# Patient Record
Sex: Female | Born: 1960 | Race: Black or African American | Hispanic: No | Marital: Single | Smoking: Never smoker
Health system: Southern US, Community
[De-identification: ages and names within clinical notes are randomized; demographics above are authoritative.]

## PROBLEM LIST (undated history)

## (undated) ENCOUNTER — Emergency Department (HOSPITAL_COMMUNITY): Payer: Medicare Other | Source: Home / Self Care

## (undated) DIAGNOSIS — C801 Malignant (primary) neoplasm, unspecified: Secondary | ICD-10-CM

## (undated) DIAGNOSIS — H5440 Blindness, one eye, unspecified eye: Secondary | ICD-10-CM

## (undated) DIAGNOSIS — E079 Disorder of thyroid, unspecified: Secondary | ICD-10-CM

## (undated) DIAGNOSIS — F79 Unspecified intellectual disabilities: Secondary | ICD-10-CM

## (undated) DIAGNOSIS — N189 Chronic kidney disease, unspecified: Secondary | ICD-10-CM

## (undated) DIAGNOSIS — E039 Hypothyroidism, unspecified: Secondary | ICD-10-CM

## (undated) DIAGNOSIS — M1990 Unspecified osteoarthritis, unspecified site: Secondary | ICD-10-CM

## (undated) DIAGNOSIS — R6250 Unspecified lack of expected normal physiological development in childhood: Secondary | ICD-10-CM

## (undated) DIAGNOSIS — G709 Myoneural disorder, unspecified: Secondary | ICD-10-CM

## (undated) HISTORY — PX: ABDOMINAL HYSTERECTOMY: SHX81

## (undated) HISTORY — DX: Disorder of thyroid, unspecified: E07.9

## (undated) HISTORY — PX: FOOT SURGERY: SHX648

---

## 2000-03-31 ENCOUNTER — Encounter: Payer: Self-pay | Admitting: Emergency Medicine

## 2000-03-31 ENCOUNTER — Emergency Department (HOSPITAL_COMMUNITY): Admission: EM | Admit: 2000-03-31 | Discharge: 2000-03-31 | Payer: Self-pay | Admitting: Emergency Medicine

## 2006-05-25 ENCOUNTER — Emergency Department (HOSPITAL_COMMUNITY): Admission: EM | Admit: 2006-05-25 | Discharge: 2006-05-25 | Payer: Self-pay | Admitting: Emergency Medicine

## 2006-05-30 ENCOUNTER — Observation Stay (HOSPITAL_COMMUNITY): Admission: RE | Admit: 2006-05-30 | Discharge: 2006-05-31 | Payer: Self-pay | Admitting: Orthopaedic Surgery

## 2015-12-06 LAB — HM MAMMOGRAPHY

## 2017-05-16 ENCOUNTER — Encounter: Payer: Self-pay | Admitting: Family Medicine

## 2017-05-16 ENCOUNTER — Ambulatory Visit (INDEPENDENT_AMBULATORY_CARE_PROVIDER_SITE_OTHER): Payer: Medicare Other | Admitting: Family Medicine

## 2017-05-16 VITALS — BP 118/76 | HR 76 | Ht 59.0 in | Wt 165.1 lb

## 2017-05-16 DIAGNOSIS — Z0000 Encounter for general adult medical examination without abnormal findings: Secondary | ICD-10-CM

## 2017-05-16 DIAGNOSIS — B351 Tinea unguium: Secondary | ICD-10-CM | POA: Diagnosis not present

## 2017-05-16 DIAGNOSIS — Z7289 Other problems related to lifestyle: Secondary | ICD-10-CM

## 2017-05-16 DIAGNOSIS — L853 Xerosis cutis: Secondary | ICD-10-CM | POA: Insufficient documentation

## 2017-05-16 DIAGNOSIS — R4189 Other symptoms and signs involving cognitive functions and awareness: Secondary | ICD-10-CM | POA: Insufficient documentation

## 2017-05-16 DIAGNOSIS — H5440 Blindness, one eye, unspecified eye: Secondary | ICD-10-CM | POA: Diagnosis not present

## 2017-05-16 DIAGNOSIS — F79 Unspecified intellectual disabilities: Secondary | ICD-10-CM

## 2017-05-16 DIAGNOSIS — E559 Vitamin D deficiency, unspecified: Secondary | ICD-10-CM

## 2017-05-16 NOTE — Progress Notes (Addendum)
Subjective:  Patient ID: Abigail Webster, female    DOB: Jan 06, 1961  Age: 57 y.o. MRN: [id number]  CC: Establish Care   HPI LAURALEI CLOUSE presents for establishment of care with her primary caregiver.  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  She has been at this home for a little over a year.  Her mother and father lived to be in their late 59s.  Their health history is unknown past that.  This patient does not drink alcohol, smoke or use illicit drugs.  She is taking an over-the-counter vitamin D supplement.  Chart review shows that she has had a hysterectomy in the past.  She does chew and bite on her left hand.  She is nonfasting this morning.  She is blind in her right eye.  She goes to the nail parlor every 2 weeks.  History Sabrina has no past medical history on file.   She has no past surgical history on file.   Her family history is not on file.She reports that  has never smoked. she has never used smokeless tobacco. Her alcohol and drug histories are not on file.  Outpatient Medications Prior to Visit  Medication Sig Dispense Refill  . Cholecalciferol (VITAMIN D3 PO) Take 200 IU daily.    . Emollient (EUCERIN EX) Apply topically.     No facility-administered medications prior to visit.     ROS Review of Systems  Constitutional: Negative for activity change, chills and fever.  HENT: Negative.   Eyes: Positive for visual disturbance.  Respiratory: Negative for cough.   Cardiovascular: Negative.   Gastrointestinal: Negative.   Endocrine: Negative for polyphagia and polyuria.  Genitourinary: Negative for difficulty urinating.  Skin: Positive for wound.  Allergic/Immunologic: Negative for immunocompromised state.  Neurological: Negative.   Psychiatric/Behavioral: Positive for behavioral problems.    Objective:  BP 118/76 (BP Location: Right Arm, Patient Position: Sitting, Cuff Size: Normal)   Pulse 76   Ht 4\' 11"  (1.499 m)   Wt 165 lb 2 oz (74.9 kg)   SpO2  97%   BMI 33.35 kg/m   Physical Exam  Constitutional: She appears well-developed and well-nourished. No distress.  HENT:  Head: Normocephalic and atraumatic.  Right Ear: External ear normal.  Left Ear: External ear normal.  Mouth/Throat: Oropharynx is clear and moist.  Eyes: Conjunctivae are normal. Right eye exhibits no discharge. Left eye exhibits no discharge. No scleral icterus.  Neck: Neck supple. No JVD present. No tracheal deviation present. No thyromegaly present.  Cardiovascular: Normal rate, regular rhythm and normal heart sounds.  Pulmonary/Chest: Effort normal and breath sounds normal. No stridor.  Abdominal: Bowel sounds are normal.  Lymphadenopathy:    She has no cervical adenopathy.  Skin: Skin is dry. She is not diaphoretic.     Psychiatric: Her behavior is normal.      Assessment & Plan:   Sharnika was seen today for establish care.  Diagnoses and all orders for this visit:  Mentally challenged  Healthcare maintenance -     CBC; Future -     Comprehensive metabolic panel; Future -     Lipid panel; Future -     TSH; Future -     Urinalysis, Routine w reflex microscopic; Future -     HIV antibody -     Hepatitis C antibody -     MM Digital Screening; Future -     Ambulatory referral to Ophthalmology  Self-mutilation  Vitamin D deficiency -  VITAMIN D 25 Hydroxy (Vit-D Deficiency, Fractures); Future  Xerosis cutis  Onychomycosis -     Ambulatory referral to Podiatry  Blind right eye   I am having Abigail Webster maintain her Cholecalciferol (VITAMIN D3 PO) and Emollient (EUCERIN EX).  No orders of the defined types were placed in this encounter.  She will return in the morning fasting with her caregiver for routine blood work.   I recommended that she use Eucerin cream for her hand and skin.  Follow-up: Return in about 3 months (around 08/14/2017).  [name], MD

## 2017-05-17 ENCOUNTER — Other Ambulatory Visit (INDEPENDENT_AMBULATORY_CARE_PROVIDER_SITE_OTHER): Payer: Medicare Other

## 2017-05-17 ENCOUNTER — Ambulatory Visit (INDEPENDENT_AMBULATORY_CARE_PROVIDER_SITE_OTHER): Payer: Medicare Other | Admitting: Behavioral Health

## 2017-05-17 DIAGNOSIS — B351 Tinea unguium: Secondary | ICD-10-CM | POA: Insufficient documentation

## 2017-05-17 DIAGNOSIS — E559 Vitamin D deficiency, unspecified: Secondary | ICD-10-CM | POA: Diagnosis not present

## 2017-05-17 DIAGNOSIS — Z0000 Encounter for general adult medical examination without abnormal findings: Secondary | ICD-10-CM

## 2017-05-17 DIAGNOSIS — H5440 Blindness, one eye, unspecified eye: Secondary | ICD-10-CM | POA: Insufficient documentation

## 2017-05-17 DIAGNOSIS — Z23 Encounter for immunization: Secondary | ICD-10-CM | POA: Diagnosis not present

## 2017-05-17 LAB — URINALYSIS, ROUTINE W REFLEX MICROSCOPIC
BILIRUBIN URINE: NEGATIVE
Ketones, ur: NEGATIVE
Leukocytes, UA: NEGATIVE
NITRITE: NEGATIVE
Specific Gravity, Urine: <=1.005 — AB (ref 1.000–1.030)
TOTAL PROTEIN, URINE-UPE24: NEGATIVE
UROBILINOGEN UA: 0.2 (ref 0.0–1.0)
Urine Glucose: NEGATIVE
WBC UA: NONE SEEN (ref 0–?)
pH: 6 (ref 5.0–8.0)

## 2017-05-17 LAB — COMPREHENSIVE METABOLIC PANEL
ALBUMIN: 4.3 g/dL (ref 3.5–5.2)
ALT: 12 U/L (ref 0–35)
AST: 17 U/L (ref 0–37)
Alkaline Phosphatase: 88 U/L (ref 39–117)
BILIRUBIN TOTAL: 0.6 mg/dL (ref 0.2–1.2)
BUN: 25 mg/dL — AB (ref 6–23)
CALCIUM: 9.7 mg/dL (ref 8.4–10.5)
CO2: 31 meq/L (ref 19–32)
CREATININE: 1.23 mg/dL — AB (ref 0.40–1.20)
Chloride: 105 mEq/L (ref 96–112)
GFR: 57.9 mL/min — ABNORMAL LOW (ref >60.00)
Glucose, Bld: 86 mg/dL (ref 70–99)
Potassium: 4.8 mEq/L (ref 3.5–5.1)
Sodium: 142 mEq/L (ref 135–145)
Total Protein: 6.8 g/dL (ref 6.0–8.3)

## 2017-05-17 LAB — CBC
HCT: 40 % (ref 36.0–46.0)
Hemoglobin: 12.9 g/dL (ref 12.0–15.0)
MCHC: 32.2 g/dL (ref 30.0–36.0)
MCV: 93.2 fl (ref 78.0–100.0)
Platelets: 230 10*3/uL (ref 150.0–400.0)
RBC: 4.29 Mil/uL (ref 3.87–5.11)
RDW: 14.2 % (ref 11.5–15.5)
WBC: 5 10*3/uL (ref 4.0–10.5)

## 2017-05-17 LAB — VITAMIN D 25 HYDROXY (VIT D DEFICIENCY, FRACTURES): VITD: 55.58 ng/mL (ref 30.00–100.00)

## 2017-05-17 LAB — LIPID PANEL
CHOLESTEROL: 200 mg/dL (ref 0–200)
HDL: 74.2 mg/dL (ref >39.00)
LDL Cholesterol: 119 mg/dL — ABNORMAL HIGH (ref 0–99)
NonHDL: 126.19
TRIGLYCERIDES: 37 mg/dL (ref 0.0–149.0)
Total CHOL/HDL Ratio: 3
VLDL: 7.4 mg/dL (ref 0.0–40.0)

## 2017-05-17 LAB — TSH: TSH: 5.8 u[IU]/mL — ABNORMAL HIGH (ref 0.35–4.50)

## 2017-05-17 NOTE — Progress Notes (Signed)
Patient came in clinic for influenza vaccination. IM injection was given in the right deltoid. Patient tolerated the injection well. No s/s of a reaction prior to leaving the nurse visit.

## 2017-05-17 NOTE — Addendum Note (Signed)
Addended by: Jon Billings on: 05/17/2017 08:47 AM   Modules accepted: Orders

## 2017-05-22 ENCOUNTER — Encounter: Payer: Self-pay | Admitting: Family Medicine

## 2017-05-23 ENCOUNTER — Ambulatory Visit (HOSPITAL_BASED_OUTPATIENT_CLINIC_OR_DEPARTMENT_OTHER)
Admission: RE | Admit: 2017-05-23 | Discharge: 2017-05-23 | Disposition: A | Discharge status: Home / Self Care | Payer: Medicare Other | Source: Ambulatory Visit | Attending: Family Medicine | Admitting: Family Medicine

## 2017-05-23 ENCOUNTER — Other Ambulatory Visit (INDEPENDENT_AMBULATORY_CARE_PROVIDER_SITE_OTHER): Payer: Medicare Other

## 2017-05-23 ENCOUNTER — Other Ambulatory Visit: Payer: Self-pay

## 2017-05-23 DIAGNOSIS — R7989 Other specified abnormal findings of blood chemistry: Secondary | ICD-10-CM

## 2017-05-23 DIAGNOSIS — Z1231 Encounter for screening mammogram for malignant neoplasm of breast: Secondary | ICD-10-CM | POA: Diagnosis present

## 2017-05-23 DIAGNOSIS — Z0000 Encounter for general adult medical examination without abnormal findings: Secondary | ICD-10-CM | POA: Diagnosis not present

## 2017-05-23 LAB — TSH: TSH: 5.61 u[IU]/mL — ABNORMAL HIGH (ref 0.35–4.50)

## 2017-05-25 ENCOUNTER — Ambulatory Visit: Payer: Medicare Other | Admitting: Family Medicine

## 2017-05-28 ENCOUNTER — Ambulatory Visit (INDEPENDENT_AMBULATORY_CARE_PROVIDER_SITE_OTHER): Payer: Medicare Other | Admitting: Family Medicine

## 2017-05-28 ENCOUNTER — Encounter: Payer: Self-pay | Admitting: Family Medicine

## 2017-05-28 VITALS — BP 120/80 | HR 65 | Ht 59.0 in | Wt 162.2 lb

## 2017-05-28 DIAGNOSIS — E039 Hypothyroidism, unspecified: Secondary | ICD-10-CM

## 2017-05-28 MED ORDER — LEVOTHYROXINE SODIUM 50 MCG PO TABS: 50.0000 ug | ORAL_TABLET | Freq: Every day | ORAL | 0 refills | Status: DC

## 2017-05-28 NOTE — Progress Notes (Signed)
Subjective:  Patient ID: Abigail Webster, female    DOB: Apr 27, 1961  Age: 57 y.o. MRN: [id number]  CC: Follow-up   HPI Abigail Webster presents for patient presents with her caregiver today for follow-up of her elevated TSH.  Current caregiver was able to supply [place] with a family history.  [name]'s mother had hypothyroidism the well and died from pancreatic cancer.  There is father had colon cancer and hypertension and has since passed.  Kimm had a colonoscopy when she was 96.  Care of your caregiver asked that we refer Abigail Webster to the podiatrist because of foot pain.  Outpatient Medications Prior to Visit  Medication Sig Dispense Refill  . Cholecalciferol (VITAMIN D3 PO) Take 200 IU daily.    . Emollient (EUCERIN EX) Apply topically.     No facility-administered medications prior to visit.     ROS Review of Systems  Constitutional: Negative.  Negative for unexpected weight change.  Respiratory: Negative for shortness of breath.   Cardiovascular: Negative for chest pain.  Gastrointestinal: Negative for abdominal pain.  Endocrine: Negative for cold intolerance and heat intolerance.    Objective:  BP 120/80 (BP Location: Right Arm, Patient Position: Sitting, Cuff Size: Normal)   Pulse 65   Ht 4\' 11"  (1.499 m)   Wt 162 lb 4 oz (73.6 kg)   SpO2 100%   BMI 32.77 kg/m   BP Readings from Last 3 Encounters:  05/28/17 120/80  05/16/17 118/76    Wt Readings from Last 3 Encounters:  05/28/17 162 lb 4 oz (73.6 kg)  05/16/17 165 lb 2 oz (74.9 kg)    Physical Exam  Constitutional: She appears well-developed and well-nourished. No distress.  HENT:  Head: Normocephalic and atraumatic.  Right Ear: External ear normal.  Left Ear: External ear normal.  Eyes: Right eye exhibits no discharge. Left eye exhibits no discharge. No scleral icterus.  Pulmonary/Chest: Effort normal.  Neurological: She is alert.  Skin: Skin is warm and dry. She is not diaphoretic.  Psychiatric: She has a normal  mood and affect. Her behavior is normal.    Lab Results  Component Value Date   WBC 5.0 05/17/2017   HGB 12.9 05/17/2017   HCT 40.0 05/17/2017   PLT 230.0 05/17/2017   GLUCOSE 86 05/17/2017   CHOL 200 05/17/2017   TRIG 37.0 05/17/2017   HDL 74.20 05/17/2017   LDLCALC 119 (H) 05/17/2017   ALT 12 05/17/2017   AST 17 05/17/2017   NA 142 05/17/2017   K 4.8 05/17/2017   CL 105 05/17/2017   CREATININE 1.23 (H) 05/17/2017   BUN 25 (H) 05/17/2017   CO2 31 05/17/2017   TSH 5.61 (H) 05/23/2017    Mm Digital Screening  Result Date: 05/23/2017 CLINICAL DATA:  Screening. EXAM: DIGITAL SCREENING BILATERAL MAMMOGRAM WITH CAD COMPARISON:  Previous exam(s). ACR Breast Density Category b: There are scattered areas of fibroglandular density. FINDINGS: There are no findings suspicious for malignancy. Images were processed with CAD. IMPRESSION: No mammographic evidence of malignancy. A result letter of this screening mammogram will be mailed directly to the patient. RECOMMENDATION: Screening mammogram in one year. (Code:SM-B-01Y) BI-RADS CATEGORY  1: Negative. Electronically Signed   By: Margarette Canada M.D.   On: 05/23/2017 13:49    Assessment & Plan:   Ariyanah was seen today for follow-up.  Diagnoses and all orders for this visit:  Acquired hypothyroidism -     levothyroxine (SYNTHROID, LEVOTHROID) 50 MCG tablet; Take 1 tablet (50 mcg total)  by mouth daily. Every morning one hour before eating.   I am having Abigail Webster start on levothyroxine. I am also having her maintain her Cholecalciferol (VITAMIN D3 PO) and Emollient (EUCERIN EX).  Meds ordered this encounter  Medications  . levothyroxine (SYNTHROID, LEVOTHROID) 50 MCG tablet    Sig: Take 1 tablet (50 mcg total) by mouth daily. Every morning one hour before eating.    Dispense:  90 tablet    Refill:  0   Neal said that she would indeed take the thyroid pill daily without issue.  I stressed the importance of taking the pill 1 hour  before the morning meal.  They will follow-up in 6-8 weeks.  Follow-up: No Follow-up on file.  [name], MD

## 2017-06-29 ENCOUNTER — Ambulatory Visit (INDEPENDENT_AMBULATORY_CARE_PROVIDER_SITE_OTHER): Payer: Medicare Other | Admitting: Podiatry

## 2017-06-29 ENCOUNTER — Ambulatory Visit (INDEPENDENT_AMBULATORY_CARE_PROVIDER_SITE_OTHER): Payer: Medicare Other

## 2017-06-29 ENCOUNTER — Telehealth: Payer: Self-pay | Admitting: Podiatry

## 2017-06-29 DIAGNOSIS — M779 Enthesopathy, unspecified: Secondary | ICD-10-CM | POA: Diagnosis not present

## 2017-06-29 DIAGNOSIS — M2141 Flat foot [pes planus] (acquired), right foot: Secondary | ICD-10-CM

## 2017-06-29 DIAGNOSIS — M2142 Flat foot [pes planus] (acquired), left foot: Secondary | ICD-10-CM | POA: Diagnosis not present

## 2017-06-29 MED ORDER — NONFORMULARY OR COMPOUNDED ITEM: 2 refills | Status: DC

## 2017-06-29 MED ORDER — DICLOFENAC SODIUM 1 % TD GEL: 2.0000 g | Freq: Four times a day (QID) | TRANSDERMAL | 2 refills | Status: DC

## 2017-06-29 NOTE — Telephone Encounter (Signed)
This is [facility] at [phone]. We received an electronic prescription for diclofenac (Voltaren) gel. We tried to bill both the brand name and the generic to pt's medicare part D plan but it is saying a prior authorization is required and to call [phone]. I'm also going to send this in through cover my meds. I wanted to let someone know we cannot send that medication out until the prior authorization is approved or if the doctor wants to change to something else. Thank you.

## 2017-06-29 NOTE — Telephone Encounter (Signed)
Yes that is Ok to switch

## 2017-06-29 NOTE — Progress Notes (Signed)
Subjective:    Patient ID: Abigail Webster, female    DOB: [birth year], 57 y.o.   MRN: [id number]  HPI 57 year old femalepresents the office today for concerns of pain to the top of his feet bilaterally which is been ongoing for about 1 year.  She states that his pain is of approximately 5/10 describes an aching sensation.  She is tried wearing more supportive shoes without significant improvement.  She does have a history of left ankle surgery.  No recent injury or trauma.  No swelling or redness.  She has no other concerns today.   Review of Systems  All other systems reviewed and are negative.  No past medical history on file.  No past surgical history on file.   Current Outpatient Medications:  .  Cholecalciferol (VITAMIN D3 PO), Take 200 IU daily., Disp: , Rfl:  .  Emollient (EUCERIN EX), Apply topically., Disp: , Rfl:  .  levothyroxine (SYNTHROID, LEVOTHROID) 50 MCG tablet, Take 1 tablet (50 mcg total) by mouth daily. Every morning one hour before eating., Disp: 90 tablet, Rfl: 0 .  diclofenac sodium (VOLTAREN) 1 % GEL, Apply 2 g topically 4 (four) times daily. Rub into affected area of foot 2 to 4 times daily, Disp: 100 g, Rfl: 2 .  NONFORMULARY OR COMPOUNDED ITEM, [facility]:  Antiinflammatory pain cream - Diclofenac 3%, Baclofen 2%, Lidocaine 2%, apply 1-2 grams to affected area 3-4 times daily, Disp: 120 each, Rfl: 2  No Known Allergies  Social History   Socioeconomic History  . Marital status: Single    Spouse name: Not on file  . Number of children: Not on file  . Years of education: Not on file  . Highest education level: Not on file  Social Needs  . Financial resource strain: Not on file  . Food insecurity - worry: Not on file  . Food insecurity - inability: Not on file  . Transportation needs - medical: Not on file  . Transportation needs - non-medical: Not on file  Occupational History  . Not on file  Tobacco Use  . Smoking status: Never Smoker  .  Smokeless tobacco: Never Used  Substance and Sexual Activity  . Alcohol use: Not on file  . Drug use: Not on file  . Sexual activity: Not on file  Other Topics Concern  . Not on file  Social History Narrative  . Not on file        Objective:   Physical Exam  General: NAD- presents with caregiver   Dermatological: Skin is warm, dry and supple bilateral. Nails x 10 are well manicured; remaining integument appears unremarkable at this time. There are no open sores, no preulcerative lesions, no rash or signs of infection present.  Vascular: Dorsalis Pedis artery and Posterior Tibial artery pedal pulses are 2/4 bilateral with immedate capillary fill time. There is no pain with calf compression, swelling, warmth, erythema.   Neruologic: Grossly intact via light touch bilateral.. Protective threshold with Semmes Wienstein monofilament intact to all pedal sites bilateral.   Musculoskeletal: There is a decrease in medial arch height upon weightbearing.  Mild tenderness in the midfoot bilaterally subjectively but there is no specific area pinpoint bony tenderness or pain to vibratory sensation.  There is no overlying edema, erythema, increased warmth bilaterally.  Ankle, subtalar joint range of motion intact without any restrictions.  Muscular strength 5/5 in all groups tested bilateral.  Gait: Unassisted, Nonantalgic.      Assessment & Plan:  57 year old  female with bilateral foot pain likely biomechanical in nature. -Treatment options discussed including all alternatives, risks, and complications -Etiology of symptoms were discussed -X-rays were obtained and reviewed with the patient.  There is evidence of flatfoot deformity as well as L of valgus of the first ray.  No evidence of acute fracture is identified today. -We discussed various treatment options and I do think she benefit from a more custom orthotic.  She has tried changing to several different shoes but any significant  improvement.  I think a more custom insert will be helpful for her.  I will get her scheduled to come back in to see Liliane Channel or [facility] for molding of inserts. She agrees to this plan.   Trula Slade DPM

## 2017-06-29 NOTE — Addendum Note (Signed)
Addended by: Harriett Sine D on: 06/29/2017 03:49 PM   Modules accepted: Orders

## 2017-06-29 NOTE — Telephone Encounter (Signed)
I informed Abigail Webster of the change.

## 2017-06-29 NOTE — Telephone Encounter (Signed)
Dr. Jacqualyn Posey ordered topical antiinflammatory pain cream from [facility] as alternative to Voltaren Gel. Faxed orders to [facility].

## 2017-07-03 ENCOUNTER — Telehealth: Payer: Self-pay | Admitting: Podiatry

## 2017-07-03 NOTE — Telephone Encounter (Signed)
This is [facility] calling in regards to [name]. She was seen on Friday 01 March and a prescription for Voltaren Gel was written. The pharmacy is having a hard time getting it filled and nobody from the doctor's office is returning their calls. If someone could call me back at [phone] so we can figure out what we need to do. Thank you. Bye.

## 2017-07-03 NOTE — Telephone Encounter (Signed)
Left message informing Ms Abigail Webster, that 06/29/2017 I had informed [facility] to cancel the voltaren gel, Dr. Jacqualyn Posey had changed the prescription to a compound cream and that [facility] [phone] would be call to discuss coverage and delivery. I apologized for not informing her 06/29/2017.

## 2017-07-04 ENCOUNTER — Other Ambulatory Visit: Payer: Self-pay | Admitting: Family Medicine

## 2017-07-09 ENCOUNTER — Ambulatory Visit (INDEPENDENT_AMBULATORY_CARE_PROVIDER_SITE_OTHER): Payer: Medicare Other | Admitting: Family Medicine

## 2017-07-09 ENCOUNTER — Encounter: Payer: Self-pay | Admitting: Family Medicine

## 2017-07-09 VITALS — BP 116/80 | HR 70 | Ht 59.0 in | Wt 164.5 lb

## 2017-07-09 DIAGNOSIS — E039 Hypothyroidism, unspecified: Secondary | ICD-10-CM | POA: Diagnosis not present

## 2017-07-09 NOTE — Progress Notes (Addendum)
Subjective:  Patient ID: Abigail Webster, female    DOB: 1960-08-30  Age: 57 y.o. MRN: [id number]  CC: Follow-up   HPI Abigail Webster presents for follow-up of her hypothyroidism.  She is here with her caretaker.  Patient is wearing new shoes and would like me to take back.  She is having no problems taking the medicine.  Outpatient Medications Prior to Visit  Medication Sig Dispense Refill  . Cholecalciferol (VITAMIN D3) 2000 units capsule TAKE 1 CAPSULE BY MOUTH ONCE A DAY. 30 capsule 1  . diclofenac sodium (VOLTAREN) 1 % GEL Apply 2 g topically 4 (four) times daily. Rub into affected area of foot 2 to 4 times daily 100 g 2  . Emollient (EUCERIN EX) Apply topically.    . NONFORMULARY OR COMPOUNDED ITEM [facility]:  Antiinflammatory pain cream - Diclofenac 3%, Baclofen 2%, Lidocaine 2%, apply 1-2 grams to affected area 3-4 times daily 120 each 2  . levothyroxine (SYNTHROID, LEVOTHROID) 50 MCG tablet Take 1 tablet (50 mcg total) by mouth daily. Every morning one hour before eating. 90 tablet 0   No facility-administered medications prior to visit.     ROS Review of Systems  Constitutional: Negative.   Respiratory: Negative for shortness of breath.   Cardiovascular: Negative for palpitations.  Endocrine: Negative for cold intolerance and heat intolerance.  Psychiatric/Behavioral: Negative.     Objective:  BP 116/80 (BP Location: Right Arm, Patient Position: Sitting, Cuff Size: Normal)   Pulse 70   Ht 4\' 11"  (1.499 m)   Wt 164 lb 8 oz (74.6 kg)   SpO2 98%   BMI 33.22 kg/m   BP Readings from Last 3 Encounters:  07/09/17 116/80  05/28/17 120/80  05/16/17 118/76    Wt Readings from Last 3 Encounters:  07/09/17 164 lb 8 oz (74.6 kg)  05/28/17 162 lb 4 oz (73.6 kg)  05/16/17 165 lb 2 oz (74.9 kg)    Physical Exam  Constitutional: She appears well-developed and well-nourished. No distress.  HENT:  Head: Normocephalic and atraumatic.  Right Ear: External ear normal.    Left Ear: External ear normal.  Neck: No JVD present. No tracheal deviation present.  Pulmonary/Chest: Effort normal. No stridor.  Neurological: She is alert.  Skin: Skin is warm and dry. She is not diaphoretic.  Psychiatric: She has a normal mood and affect. Her behavior is normal.    Lab Results  Component Value Date   WBC 5.0 05/17/2017   HGB 12.9 05/17/2017   HCT 40.0 05/17/2017   PLT 230.0 05/17/2017   GLUCOSE 86 05/17/2017   CHOL 200 05/17/2017   TRIG 37.0 05/17/2017   HDL 74.20 05/17/2017   LDLCALC 119 (H) 05/17/2017   ALT 12 05/17/2017   AST 17 05/17/2017   NA 142 05/17/2017   K 4.8 05/17/2017   CL 105 05/17/2017   CREATININE 1.23 (H) 05/17/2017   BUN 25 (H) 05/17/2017   CO2 31 05/17/2017   TSH 2.48 07/09/2017    Mm Digital Screening  Result Date: 05/23/2017 CLINICAL DATA:  Screening. EXAM: DIGITAL SCREENING BILATERAL MAMMOGRAM WITH CAD COMPARISON:  Previous exam(s). ACR Breast Density Category b: There are scattered areas of fibroglandular density. FINDINGS: There are no findings suspicious for malignancy. Images were processed with CAD. IMPRESSION: No mammographic evidence of malignancy. A result letter of this screening mammogram will be mailed directly to the patient. RECOMMENDATION: Screening mammogram in one year. (Code:SM-B-01Y) BI-RADS CATEGORY  1: Negative. Electronically Signed   By:  Margarette Canada M.D.   On: 05/23/2017 13:49    Assessment & Plan:   Odean was seen today for follow-up.  Diagnoses and all orders for this visit:  Acquired hypothyroidism -     TSH -     levothyroxine (SYNTHROID, LEVOTHROID) 50 MCG tablet; Take 1 tablet (50 mcg total) by mouth daily. Every morning one hour before eating.   I am having Abigail Webster maintain her Emollient (EUCERIN EX), diclofenac sodium, NONFORMULARY OR COMPOUNDED ITEM, Vitamin D3, and levothyroxine.  Meds ordered this encounter  Medications  . levothyroxine (SYNTHROID, LEVOTHROID) 50 MCG tablet    Sig:  Take 1 tablet (50 mcg total) by mouth daily. Every morning one hour before eating.    Dispense:  90 tablet    Refill:  1   Again discussed the importance of taking the thyroid pill on an empty stomach in the morning 1 hour before breakfast.  Follow-up will pend results of TSH test.  Follow-up: Return suggested follow up pends blood work results. [name], MD

## 2017-07-09 NOTE — Patient Instructions (Addendum)
Hypothyroidism Hypothyroidism is a disorder of the thyroid. The thyroid is a large gland that is located in the lower front of the neck. The thyroid releases hormones that control how the body works. With hypothyroidism, the thyroid does not make enough of these hormones. What are the causes? Causes of hypothyroidism may include:  Viral infections.  Pregnancy.  Your own defense system (immune system) attacking your thyroid.  Certain medicines.  Birth defects.  Past radiation treatments to your head or neck.  Past treatment with radioactive iodine.  Past surgical removal of part or all of your thyroid.  Problems with the gland that is located in the center of your brain (pituitary).  What are the signs or symptoms? Signs and symptoms of hypothyroidism may include:  Feeling as though you have no energy (lethargy).  Inability to tolerate cold.  Weight gain that is not explained by a change in diet or exercise habits.  Dry skin.  Coarse hair.  Menstrual irregularity.  Slowing of thought processes.  Constipation.  Sadness or depression.  How is this diagnosed? Your health care provider may diagnose hypothyroidism with blood tests and ultrasound tests. How is this treated? Hypothyroidism is treated with medicine that replaces the hormones that your body does not make. After you begin treatment, it may take several weeks for symptoms to go away. Follow these instructions at home:  Take medicines only as directed by your health care provider.  If you start taking any new medicines, tell your health care provider.  Keep all follow-up visits as directed by your health care provider. This is important. As your condition improves, your dosage needs may change. You will need to have blood tests regularly so that your health care provider can watch your condition. Contact a health care provider if:  Your symptoms do not get better with treatment.  You are taking thyroid  replacement medicine and: ? You sweat excessively. ? You have tremors. ? You feel anxious. ? You lose weight rapidly. ? You cannot tolerate heat. ? You have emotional swings. ? You have diarrhea. ? You feel weak. Get help right away if:  You develop chest pain.  You develop an irregular heartbeat.  You develop a rapid heartbeat. This information is not intended to replace advice given to you by your health care provider. Make sure you discuss any questions you have with your health care provider. Document Released: 04/17/2005 Document Revised: 09/23/2015 Document Reviewed: 09/02/2013 Elsevier Interactive Patient Education  2018 Elsevier Inc.  

## 2017-07-10 ENCOUNTER — Other Ambulatory Visit: Payer: Medicare Other

## 2017-07-10 DIAGNOSIS — M79673 Pain in unspecified foot: Secondary | ICD-10-CM

## 2017-07-10 LAB — TSH: TSH: 2.48 u[IU]/mL (ref 0.35–4.50)

## 2017-07-11 ENCOUNTER — Telehealth: Payer: Self-pay | Admitting: Family Medicine

## 2017-07-11 MED ORDER — LEVOTHYROXINE SODIUM 50 MCG PO TABS: 50.0000 ug | ORAL_TABLET | Freq: Every day | ORAL | 1 refills | Status: DC

## 2017-07-11 NOTE — Telephone Encounter (Signed)
Copied from [phone]. Topic: Quick Communication - See Telephone Encounter >> Jul 11, 2017  4:38 PM Neva Seat wrote: Pt needing call back regarding lab results.  NT was busy at time of call.

## 2017-07-11 NOTE — Addendum Note (Signed)
Addended by: Jon Billings on: 07/11/2017 08:15 AM   Modules accepted: Orders

## 2017-07-12 NOTE — Telephone Encounter (Signed)
Patient called, left VM to return call for results. Results are in result notes for future documentation.

## 2017-07-12 NOTE — Telephone Encounter (Signed)
Mrs Jerline Pain, the pts caregiver, returned call.  -  Her number is [phone]

## 2017-07-12 NOTE — Telephone Encounter (Signed)
Attempted to call patient back- left message to call office- labs in basket.

## 2017-07-31 ENCOUNTER — Ambulatory Visit: Payer: Medicare Other | Admitting: Orthotics

## 2017-07-31 DIAGNOSIS — M2142 Flat foot [pes planus] (acquired), left foot: Secondary | ICD-10-CM

## 2017-07-31 DIAGNOSIS — M2141 Flat foot [pes planus] (acquired), right foot: Secondary | ICD-10-CM

## 2017-07-31 NOTE — Progress Notes (Signed)
Patient came in today to pick up custom made foot orthotics.  The goals were accomplished and the patient reported no dissatisfaction with said orthotics.  Patient was advised of breakin period and how to report any issues. 

## 2017-08-06 ENCOUNTER — Encounter: Payer: Self-pay | Admitting: Family Medicine

## 2017-08-06 ENCOUNTER — Ambulatory Visit (INDEPENDENT_AMBULATORY_CARE_PROVIDER_SITE_OTHER): Payer: Medicare Other | Admitting: Family Medicine

## 2017-08-06 VITALS — BP 122/80 | HR 84 | Ht 59.0 in | Wt 162.0 lb

## 2017-08-06 DIAGNOSIS — H5440 Blindness, one eye, unspecified eye: Secondary | ICD-10-CM

## 2017-08-06 DIAGNOSIS — E039 Hypothyroidism, unspecified: Secondary | ICD-10-CM | POA: Diagnosis not present

## 2017-08-06 DIAGNOSIS — F79 Unspecified intellectual disabilities: Secondary | ICD-10-CM | POA: Diagnosis not present

## 2017-08-06 NOTE — Progress Notes (Signed)
Subjective:  Patient ID: Abigail Webster, female    DOB: 08/18/1960  Age: 57 y.o. MRN: [id number]  CC: Form Completion   HPI Abigail Webster presents for checkup on her hypothyroidism and she needs a form filled out for the [employer].  She is taking her thyroid medicine as directed 1 hour before her morning meal.  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  This is this is her second year of doing the [employer].  She will be participating in the [place] Event!  Outpatient Medications Prior to Visit  Medication Sig Dispense Refill  . Cholecalciferol (VITAMIN D3) 2000 units capsule TAKE 1 CAPSULE BY MOUTH ONCE A DAY. 30 capsule 1  . diclofenac sodium (VOLTAREN) 1 % GEL Apply 2 g topically 4 (four) times daily. Rub into affected area of foot 2 to 4 times daily 100 g 2  . Emollient (EUCERIN EX) Apply topically.    Marland Kitchen levothyroxine (SYNTHROID, LEVOTHROID) 50 MCG tablet Take 1 tablet (50 mcg total) by mouth daily. Every morning one hour before eating. 90 tablet 1  . NONFORMULARY OR COMPOUNDED ITEM [facility]:  Antiinflammatory pain cream - Diclofenac 3%, Baclofen 2%, Lidocaine 2%, apply 1-2 grams to affected area 3-4 times daily 120 each 2   No facility-administered medications prior to visit.     ROS Review of Systems  Constitutional: Negative for diaphoresis, fever and unexpected weight change.  HENT: Negative.   Eyes: Positive for visual disturbance. Negative for pain.  Respiratory: Negative.   Cardiovascular: Negative.   Gastrointestinal: Negative.   Endocrine: Negative for polyphagia and polyuria.  Genitourinary: Negative.   Musculoskeletal: Negative.   Skin: Positive for wound.  Allergic/Immunologic: Negative for immunocompromised state.  Neurological: Negative for weakness and headaches.  Hematological: Negative.     Objective:  BP 122/80 (BP Location: Left Arm, Patient Position: Sitting, Cuff Size: Normal)   Pulse 84   Ht 4\' 11"  (1.499 m)   Wt 162 lb (73.5  kg)   SpO2 98%   BMI 32.72 kg/m   BP Readings from Last 3 Encounters:  08/06/17 122/80  07/09/17 116/80  05/28/17 120/80    Wt Readings from Last 3 Encounters:  08/06/17 162 lb (73.5 kg)  07/09/17 164 lb 8 oz (74.6 kg)  05/28/17 162 lb 4 oz (73.6 kg)    Physical Exam  Constitutional: She appears well-developed and well-nourished.  HENT:  Head: Normocephalic and atraumatic.  Right Ear: External ear normal.  Left Ear: External ear normal.  Nose: Nose normal.  Mouth/Throat: Oropharynx is clear and moist.  Eyes: Conjunctivae are normal. Right eye exhibits no discharge. Left eye exhibits no discharge. No scleral icterus.  Neck: Normal range of motion. Neck supple. No JVD present. No tracheal deviation present. No thyromegaly present.  Cardiovascular: Normal rate and regular rhythm.  Murmur heard. Pulmonary/Chest: Effort normal and breath sounds normal.  Abdominal: Soft.  Musculoskeletal: Normal range of motion. She exhibits no edema, tenderness or deformity.  Lymphadenopathy:    She has no cervical adenopathy.  Neurological: She is alert.  Skin: Skin is warm and dry.  Psychiatric: She has a normal mood and affect. Her behavior is normal.    Lab Results  Component Value Date   WBC 5.0 05/17/2017   HGB 12.9 05/17/2017   HCT 40.0 05/17/2017   PLT 230.0 05/17/2017   GLUCOSE 86 05/17/2017   CHOL 200 05/17/2017   TRIG 37.0 05/17/2017   HDL 74.20 05/17/2017   LDLCALC 119 (H)  05/17/2017   ALT 12 05/17/2017   AST 17 05/17/2017   NA 142 05/17/2017   K 4.8 05/17/2017   CL 105 05/17/2017   CREATININE 1.23 (H) 05/17/2017   BUN 25 (H) 05/17/2017   CO2 31 05/17/2017   TSH 2.48 07/09/2017    Mm Digital Screening  Result Date: 05/23/2017 CLINICAL DATA:  Screening. EXAM: DIGITAL SCREENING BILATERAL MAMMOGRAM WITH CAD COMPARISON:  Previous exam(s). ACR Breast Density Category b: There are scattered areas of fibroglandular density. FINDINGS: There are no findings suspicious for  malignancy. Images were processed with CAD. IMPRESSION: No mammographic evidence of malignancy. A result letter of this screening mammogram will be mailed directly to the patient. RECOMMENDATION: Screening mammogram in one year. (Code:SM-B-01Y) BI-RADS CATEGORY  1: Negative. Electronically Signed   By: Margarette Canada M.D.   On: 05/23/2017 13:49    Assessment & Plan:   Abigail Webster was seen today for form completion.  Diagnoses and all orders for this visit:  Acquired hypothyroidism  Mentally challenged  Blind right eye   I am having Abigail Webster maintain her Emollient (EUCERIN EX), diclofenac sodium, NONFORMULARY OR COMPOUNDED ITEM, Vitamin D3, and levothyroxine.  No orders of the defined types were placed in this encounter.  Completed patient's form so she could participate in a [employer].  Follow-up: No follow-ups on file.  [name], MD

## 2017-09-03 ENCOUNTER — Other Ambulatory Visit: Payer: Self-pay | Admitting: Family Medicine

## 2018-01-17 ENCOUNTER — Other Ambulatory Visit: Payer: Self-pay | Admitting: Family Medicine

## 2018-01-17 DIAGNOSIS — E039 Hypothyroidism, unspecified: Secondary | ICD-10-CM

## 2018-02-12 ENCOUNTER — Ambulatory Visit (INDEPENDENT_AMBULATORY_CARE_PROVIDER_SITE_OTHER): Payer: Medicare Other

## 2018-02-12 DIAGNOSIS — Z23 Encounter for immunization: Secondary | ICD-10-CM

## 2018-02-12 NOTE — Progress Notes (Signed)
Pt came in to received a regular flu shot. Pt tolerated well.

## 2018-05-31 ENCOUNTER — Ambulatory Visit: Payer: Medicare Other | Admitting: Family Medicine

## 2018-05-31 ENCOUNTER — Ambulatory Visit (INDEPENDENT_AMBULATORY_CARE_PROVIDER_SITE_OTHER): Payer: Medicare Other | Admitting: Family Medicine

## 2018-05-31 ENCOUNTER — Encounter: Payer: Self-pay | Admitting: Family Medicine

## 2018-05-31 VITALS — BP 130/80 | HR 78 | Ht 59.0 in | Wt 165.5 lb

## 2018-05-31 DIAGNOSIS — F79 Unspecified intellectual disabilities: Secondary | ICD-10-CM | POA: Diagnosis not present

## 2018-05-31 DIAGNOSIS — E559 Vitamin D deficiency, unspecified: Secondary | ICD-10-CM | POA: Diagnosis not present

## 2018-05-31 DIAGNOSIS — H6123 Impacted cerumen, bilateral: Secondary | ICD-10-CM

## 2018-05-31 DIAGNOSIS — E039 Hypothyroidism, unspecified: Secondary | ICD-10-CM | POA: Diagnosis not present

## 2018-05-31 DIAGNOSIS — Z0000 Encounter for general adult medical examination without abnormal findings: Secondary | ICD-10-CM

## 2018-05-31 NOTE — Progress Notes (Addendum)
Established Patient Office Visit  Subjective:  Patient ID: Abigail Webster, female    DOB: June 11, 1960  Age: 58 y.o. MRN: [id number]  CC:  Chief Complaint  Patient presents with  . Form Completion    HPI Abigail Webster presents for follow-up of her hypothyroidism.  Patient is accompanied by her escort from her group home.  Patient has been doing okay over the last year.  She assists with all of her ADLs.  She had developed mild incontinence last month that resolved.  She has occasional nighttime incontinence.   History reviewed. No pertinent past medical history.  History reviewed. No pertinent surgical history.  Family History  Problem Relation Age of Onset  . Pancreatic cancer Mother   . Hypertension Mother   . Hypothyroidism Mother   . Colon cancer Father   . Hypertension Father     Social History   Socioeconomic History  . Marital status: Single    Spouse name: Not on file  . Number of children: Not on file  . Years of education: Not on file  . Highest education level: Not on file  Occupational History  . Not on file  Social Needs  . Financial resource strain: Not on file  . Food insecurity:    Worry: Not on file    Inability: Not on file  . Transportation needs:    Medical: Not on file    Non-medical: Not on file  Tobacco Use  . Smoking status: Never Smoker  . Smokeless tobacco: Never Used  Substance and Sexual Activity  . Alcohol use: Not on file  . Drug use: Not on file  . Sexual activity: Not on file  Lifestyle  . Physical activity:    Days per week: Not on file    Minutes per session: Not on file  . Stress: Not on file  Relationships  . Social connections:    Talks on phone: Not on file    Gets together: Not on file    Attends religious service: Not on file    Active member of club or organization: Not on file    Attends meetings of clubs or organizations: Not on file    Relationship status: Not on file  . Intimate partner violence:    Fear of  current or ex partner: Not on file    Emotionally abused: Not on file    Physically abused: Not on file    Forced sexual activity: Not on file  Other Topics Concern  . Not on file  Social History Narrative  . Not on file    Outpatient Medications Prior to Visit  Medication Sig Dispense Refill  . Emollient (EUCERIN EX) Apply topically.    . NONFORMULARY OR COMPOUNDED ITEM [facility]:  Antiinflammatory pain cream - Diclofenac 3%, Baclofen 2%, Lidocaine 2%, apply 1-2 grams to affected area 3-4 times daily 120 each 2  . RESTASIS 0.05 % ophthalmic emulsion     . Cholecalciferol (VITAMIN D3) 2000 units capsule TAKE 1 CAPSULE BY MOUTH ONCE A DAY. 30 capsule 1  . Cholecalciferol (VITAMIN D3) 2000 units capsule TAKE 1 CAPSULE BY MOUTH ONCE A DAY. 30 capsule 11  . diclofenac sodium (VOLTAREN) 1 % GEL Apply 2 g topically 4 (four) times daily. Rub into affected area of foot 2 to 4 times daily 100 g 2  . levothyroxine (SYNTHROID, LEVOTHROID) 50 MCG tablet TAKE (1) TABLET BY MOUTH ONCE DAILY IN THE MORNING 1 HOUR BEFORE EATING 30 tablet  6   No facility-administered medications prior to visit.     No Known Allergies  ROS Review of Systems  Constitutional: Negative.   Eyes: Positive for visual disturbance.  Respiratory: Negative.   Cardiovascular: Negative.   Gastrointestinal: Negative.   Endocrine: Negative for polyphagia and polyuria.  Genitourinary: Negative for dysuria and frequency.  Skin: Positive for color change.  Neurological: Negative for seizures, weakness and numbness.  Hematological: Does not bruise/bleed easily.      Objective:    Physical Exam  Constitutional: She appears well-developed and well-nourished. No distress.  HENT:  Head: Normocephalic and atraumatic.  Right Ear: External ear normal. Tympanic membrane is not injected, not erythematous and not retracted.  Left Ear: External ear normal. Tympanic membrane is not injected, not erythematous and not  retracted.  Ears:  Mouth/Throat: Uvula is midline and oropharynx is clear and moist. Abnormal dentition. No oropharyngeal exudate.  Eyes: Right eye exhibits no discharge. Left eye exhibits no discharge. No scleral icterus.  Neck: Neck supple. No JVD present. No tracheal deviation present. No thyromegaly present.  Cardiovascular: Normal rate, regular rhythm and normal heart sounds.  Pulmonary/Chest: Effort normal and breath sounds normal. No stridor.  Abdominal: Bowel sounds are normal.  Musculoskeletal:        General: No edema.  Lymphadenopathy:    She has no cervical adenopathy.  Skin: Skin is warm and dry. She is not diaphoretic.     Psychiatric: She has a normal mood and affect. Her behavior is normal.    BP 130/80   Pulse 78   Ht 4\' 11"  (1.499 m)   Wt 165 lb 8 oz (75.1 kg)   SpO2 97%   BMI 33.43 kg/m  Wt Readings from Last 3 Encounters:  05/31/18 165 lb 8 oz (75.1 kg)  08/06/17 162 lb (73.5 kg)  07/09/17 164 lb 8 oz (74.6 kg)   BP Readings from Last 3 Encounters:  05/31/18 130/80  08/06/17 122/80  07/09/17 116/80   Guideline developer:  UpToDate (see UpToDate for funding source) Date Released: June 2014  Health Maintenance Due  Topic Date Due  . Hepatitis C Screening  1960/12/17  . HIV Screening  07/04/1975  . TETANUS/TDAP  07/04/1979  . PAP SMEAR-Modifier  07/03/1981    There are no preventive care reminders to display for this patient.  Lab Results  Component Value Date   TSH 2.09 05/31/2018   Lab Results  Component Value Date   WBC 6.1 05/31/2018   HGB 12.4 05/31/2018   HCT 36.5 05/31/2018   MCV 90.1 05/31/2018   PLT 276 05/31/2018   Lab Results  Component Value Date   NA 142 05/31/2018   K 4.6 05/31/2018   CO2 24 05/31/2018   GLUCOSE 86 05/31/2018   BUN 21 05/31/2018   CREATININE 1.13 (H) 05/31/2018   BILITOT 0.6 05/17/2017   ALKPHOS 88 05/17/2017   AST 17 05/17/2017   ALT 12 05/17/2017   PROT 6.8 05/17/2017   ALBUMIN 4.3 05/17/2017     CALCIUM 9.4 05/31/2018   GFR 57.90 (L) 05/17/2017   Lab Results  Component Value Date   CHOL 200 05/17/2017   Lab Results  Component Value Date   HDL 74.20 05/17/2017   Lab Results  Component Value Date   LDLCALC 119 (H) 05/17/2017   Lab Results  Component Value Date   TRIG 37.0 05/17/2017   Lab Results  Component Value Date   CHOLHDL 3 05/17/2017   No results found for: HGBA1C  Assessment & Plan:   Problem List Items Addressed This Visit      Endocrine   Acquired hypothyroidism - Primary   Relevant Medications   levothyroxine (SYNTHROID, LEVOTHROID) 50 MCG tablet   Other Relevant Orders   TSH (Completed)     Other   Mentally challenged   Healthcare maintenance   Relevant Orders   CBC (Completed)   Basic metabolic panel (Completed)   Vitamin D deficiency   Relevant Medications   Cholecalciferol (VITAMIN D3) 50 MCG (2000 UT) capsule   Other Relevant Orders   VITAMIN D 25 Hydroxy (Vit-D Deficiency, Fractures) (Completed)    Other Visit Diagnoses    Excessive cerumen in both ear canals          Meds ordered this encounter  Medications  . Cholecalciferol (VITAMIN D3) 50 MCG (2000 UT) capsule    Sig: Take 1 capsule (2,000 Units total) by mouth daily.    Dispense:  30 capsule    Refill:  11  . levothyroxine (SYNTHROID, LEVOTHROID) 50 MCG tablet    Sig: TAKE (1) TABLET BY MOUTH ONCE DAILY IN THE MORNING 1 HOUR BEFORE EATING    Dispense:  90 tablet    Refill:  4    Follow-up: Return in about 6 months (around 11/29/2018), or if symptoms worsen or fail to improve.   Encouraged patient's caregiver to give her thyroid medicine on a fasting stomach preferably an hour before eating.  And will use Debrox in the patient's ears for couple of weeks and then follow-up for lavage.  Advised fluid restriction to 1 hour prior to bedtime.

## 2018-06-01 LAB — CBC
HCT: 36.5 % (ref 35.0–45.0)
Hemoglobin: 12.4 g/dL (ref 11.7–15.5)
MCH: 30.6 pg (ref 27.0–33.0)
MCHC: 34 g/dL (ref 32.0–36.0)
MCV: 90.1 fL (ref 80.0–100.0)
MPV: 11.1 fL (ref 7.5–12.5)
Platelets: 276 10*3/uL (ref 140–400)
RBC: 4.05 10*6/uL (ref 3.80–5.10)
RDW: 13.1 % (ref 11.0–15.0)
WBC: 6.1 10*3/uL (ref 3.8–10.8)

## 2018-06-01 LAB — BASIC METABOLIC PANEL
BUN/Creatinine Ratio: 19 (calc) (ref 6–22)
BUN: 21 mg/dL (ref 7–25)
CALCIUM: 9.4 mg/dL (ref 8.6–10.4)
CO2: 24 mmol/L (ref 20–32)
Chloride: 106 mmol/L (ref 98–110)
Creat: 1.13 mg/dL — ABNORMAL HIGH (ref 0.50–1.05)
Glucose, Bld: 86 mg/dL (ref 65–99)
Potassium: 4.6 mmol/L (ref 3.5–5.3)
Sodium: 142 mmol/L (ref 135–146)

## 2018-06-01 LAB — TSH: TSH: 2.09 m[IU]/L (ref 0.40–4.50)

## 2018-06-01 LAB — VITAMIN D 25 HYDROXY (VIT D DEFICIENCY, FRACTURES): Vit D, 25-Hydroxy: 40 ng/mL (ref 30–100)

## 2018-06-03 MED ORDER — VITAMIN D3 50 MCG (2000 UT) PO CAPS: 2000.0000 [IU] | ORAL_CAPSULE | Freq: Every day | ORAL | 11 refills | Status: DC

## 2018-06-03 MED ORDER — LEVOTHYROXINE SODIUM 50 MCG PO TABS: ORAL_TABLET | ORAL | 4 refills | Status: DC

## 2018-06-03 NOTE — Addendum Note (Signed)
Addended by: Jon Billings on: 06/03/2018 09:09 AM   Modules accepted: Orders

## 2018-08-27 ENCOUNTER — Telehealth: Payer: Self-pay | Admitting: Family Medicine

## 2018-08-27 NOTE — Telephone Encounter (Signed)
I called and left message on patient voicemail to call office and schedule follow up appointment with Dr.Kremer.

## 2019-01-08 ENCOUNTER — Ambulatory Visit (INDEPENDENT_AMBULATORY_CARE_PROVIDER_SITE_OTHER): Payer: Medicare Other

## 2019-01-08 DIAGNOSIS — Z23 Encounter for immunization: Secondary | ICD-10-CM

## 2019-03-31 ENCOUNTER — Other Ambulatory Visit: Payer: Self-pay

## 2019-03-31 DIAGNOSIS — Z20822 Contact with and (suspected) exposure to covid-19: Secondary | ICD-10-CM

## 2019-04-01 LAB — NOVEL CORONAVIRUS, NAA: SARS-CoV-2, NAA: NOT DETECTED

## 2019-04-07 ENCOUNTER — Ambulatory Visit: Payer: Self-pay | Admitting: *Deleted

## 2019-04-07 ENCOUNTER — Encounter: Payer: Self-pay | Admitting: Family Medicine

## 2019-04-07 ENCOUNTER — Other Ambulatory Visit: Payer: Self-pay

## 2019-04-07 ENCOUNTER — Ambulatory Visit (INDEPENDENT_AMBULATORY_CARE_PROVIDER_SITE_OTHER): Payer: Medicare Other | Admitting: Family Medicine

## 2019-04-07 DIAGNOSIS — H02849 Edema of unspecified eye, unspecified eyelid: Secondary | ICD-10-CM | POA: Diagnosis not present

## 2019-04-07 NOTE — Telephone Encounter (Signed)
Abigail Webster the caretaker in the group home where [name] lives is calling in.   The pt woke up this morning with both eyes swollen.   She only has her left eye.   She is c/o having a hard time seeing due to the swelling.   No itching, Webster, redness, drainage.  Pt has developmental delays so is unable to give much details.  I warm transferred the call into Dr. Bebe Webster office to Abigail Webster.   [name] was agreeable to a virtual visit.   When I connected them the call got disconnected.    I called Abigail Webster back but it went straight to her voicemail.   I then called Dr. Bebe Webster office back and spoke with Abigail Webster again.   She was talking with [name] when their call got disconnected.   She is hoping [name] will call back in.  I sent my notes to Dr. Bebe Webster office.      Reason for Disposition . [1] SEVERE eyelid swelling (i.e., shut or almost) AND [2] involves both eyes AND [3] itchy    No itching  Answer Assessment - Initial Assessment Questions 1. ONSET: "When did the swelling start?" (e.g., minutes, hours, days)     Dr. Jerline Webster the caretaker called in.    Pt has one eye.   The good eye is swollen and she is c/o having a hard time seeing.    2. LOCATION: "What part of the eyelids is swollen?"     The upper eyelid mostly.    The "bad" eye has bags under it.    Good eye is the left side. 3. SEVERITY: "How swollen is it?"     Her left eye is almost swollen shut.    She lives in a group car.   We are under a stay at home order so she has not been exposed before.   No new medications, no diet change. 4. ITCHING: "Is there any itching?" If so, ask: "How much?"   (Scale 1-10; mild, moderate or severe)     No 5. Webster: "Is the swelling painful to touch?" If so, ask: "How painful is it?"   (Scale 1-10; mild, moderate or severe)     No 6. FEVER: "Do you have a fever?" If so, ask: "What is it, how was it measured, and when did it start?"      No fever   97.6 last night 7. CAUSE: "What do you  think is causing the swelling?"     Don't know.   She has severe developmental delays so she can't really say. 8. RECURRENT SYMPTOM: "Have you had eyelid swelling before?" If so, ask: "When was the last time?" "What happened that time?"     No 9. OTHER SYMPTOMS: "Do you have any other symptoms?" (e.g., blurred vision, eye discharge, rash, runny nose)     No eye discharge 10. PREGNANCY: "Is there any chance you are pregnant?" "When was your last menstrual period?"       N/A due to age  Protocols used: EYE - [facility]

## 2019-04-07 NOTE — Assessment & Plan Note (Addendum)
Appears to be traumatic, possible from recent fall.  No recent medication changes, not on any blood thinners.  She does not notice any significant pain and reports vision is intact when able to open eye.  No other red flag symptoms including disorientation, headache, nausea or dizziness.  Recommend icing/cool compress to area to help with swelling.  If not improving in the next 36-48 hours let us know.  Discussed red flags that would prompt urgent/emergent evaluation including eye pain, vision change,  worsening swelling, nausea, headache, fever or chills.  Patient and caregiver express understanding.

## 2019-04-07 NOTE — Progress Notes (Signed)
Abigail Webster - 58 y.o. female MRN [id number]  Date of birth: [birth year]   This visit type was conducted due to national recommendations for restrictions regarding the COVID-19 Pandemic (e.g. social distancing).  This format is felt to be most appropriate for this patient at this time.  All issues noted in this document were discussed and addressed.  No physical exam was performed (except for noted visual exam findings with Video Visits).  I discussed the limitations of evaluation and management by telemedicine and the availability of in person appointments. The patient expressed understanding and agreed to proceed.  I connected with@ on 04/07/19 at  9:20 AM EST by a video enabled telemedicine application and verified that I am speaking with the correct person using two identifiers.  Present at visit: Luetta Nutting, DO Casper Harrison   Patient Location: [facility] [place] [facility] [id number]   Provider location:   [place]  Chief Complaint  Patient presents with  . Facial Swelling    Historian: [name] Parker(therapeutic provider) -- Swelling in both ocular spaces- no eye in right socket and space looks very swollen, can barely open her left eye, dark bags. Latisha says that she bumped her head 2 days ago, little headache after falling out of the bed. Pt was not seenm after her fall, staff evaluated her and did not feel it needed medical attention. Pt was oriented.      HPI  Abigail Webster is a 58 y.o. female who presents via audio/video conferencing for a telehealth visit today.  She has complaint of b/l eye swelling.  Part of the history is provided by her caregiver Posey Rea.  Ms. Jerline Pain reports that she was contacted by another staff member at the group home this morning that Kadesha had swelling to her eyes.  Karn denies pain and when she does open her eye she has does not have any problems with vision.  She did have a fall from her bed around 2 days ago and bumped  her head.  Fall was not witness.  She did have mild headache afterwards but this has resolved.  She does not recall hitting her face.  She denies nausea, dizziness, changes to appetite, or recent medication changes.  She is compliant with thyroid medication.    ROS:  A comprehensive ROS was completed and negative except as noted per HPI  History reviewed. No pertinent past medical history.  History reviewed. No pertinent surgical history.  Family History  Problem Relation Age of Onset  . Pancreatic cancer Mother   . Hypertension Mother   . Hypothyroidism Mother   . Colon cancer Father   . Hypertension Father     Social History   Socioeconomic History  . Marital status: Single    Spouse name: Not on file  . Number of children: Not on file  . Years of education: Not on file  . Highest education level: Not on file  Occupational History  . Not on file  Social Needs  . Financial resource strain: Not on file  . Food insecurity    Worry: Not on file    Inability: Not on file  . Transportation needs    Medical: Not on file    Non-medical: Not on file  Tobacco Use  . Smoking status: Never Smoker  . Smokeless tobacco: Never Used  Substance and Sexual Activity  . Alcohol use: Not on file  . Drug use: Not on file  . Sexual activity:  Not on file  Lifestyle  . Physical activity    Days per week: Not on file    Minutes per session: Not on file  . Stress: Not on file  Relationships  . Social Herbalist on phone: Not on file    Gets together: Not on file    Attends religious service: Not on file    Active member of club or organization: Not on file    Attends meetings of clubs or organizations: Not on file    Relationship status: Not on file  . Intimate partner violence    Fear of current or ex partner: Not on file    Emotionally abused: Not on file    Physically abused: Not on file    Forced sexual activity: Not on file  Other Topics Concern  . Not on file   Social History Narrative  . Not on file     Current Outpatient Medications:  .  Cholecalciferol (VITAMIN D3) 50 MCG (2000 UT) capsule, Take 1 capsule (2,000 Units total) by mouth daily., Disp: 30 capsule, Rfl: 11 .  Emollient (EUCERIN EX), Apply topically., Disp: , Rfl:  .  levothyroxine (SYNTHROID, LEVOTHROID) 50 MCG tablet, TAKE (1) TABLET BY MOUTH ONCE DAILY IN THE MORNING 1 HOUR BEFORE EATING, Disp: 90 tablet, Rfl: 4 .  NONFORMULARY OR COMPOUNDED ITEM, [facility]:  Antiinflammatory pain cream - Diclofenac 3%, Baclofen 2%, Lidocaine 2%, apply 1-2 grams to affected area 3-4 times daily, Disp: 120 each, Rfl: 2 .  RESTASIS 0.05 % ophthalmic emulsion, , Disp: , Rfl:   EXAM:  VITALS per patient if applicable: BP 99991111 (BP Location: Left Arm, Patient Position: Sitting, Cuff Size: Normal)   Pulse 62   Temp 97.6 F (36.4 C) (Temporal)   Wt 165 lb (74.8 kg)   BMI 33.33 kg/m   GENERAL: alert, oriented, appears well and in no acute distress  HEENT: atraumatic, conjunttiva clear.  Swelling and ecchymoses of upper and lower lids bilaterally L>R.  She is able to open L eye and reports normal vision.  She has no pain with opening of the eye or pain with palpation from her caregiver.   R eye with anophthalmia   NECK: normal movements of the head and neck  LUNGS: on inspection no signs of respiratory distress, breathing rate appears normal, no obvious gross SOB, gasping or wheezing  CV: no obvious cyanosis  MS: moves all visible extremities without noticeable abnormality  PSYCH/NEURO: pleasant and cooperative, no obvious depression or anxiety, speech and thought processing grossly intact  ASSESSMENT AND PLAN:  Discussed the following assessment and plan:  Swelling of eyelid Appears to be traumatic, possible from recent fall.  No recent medication changes, not on any blood thinners.  She does not notice any significant pain and reports vision is intact when able to open eye.  No  other red flag symptoms including disorientation, headache, nausea or dizziness.  Recommend icing/cool compress to area to help with swelling.  If not improving in the next 36-48 hours let us know.  Discussed red flags that would prompt urgent/emergent evaluation including eye pain, vision change,  worsening swelling, nausea, headache, fever or chills.  Patient and caregiver express understanding.      I discussed the assessment and treatment plan with the patient. The patient was provided an opportunity to ask questions and all were answered. The patient agreed with the plan and demonstrated an understanding of the instructions.   The patient was  advised to call back or seek an in-person evaluation if the symptoms worsen or if the condition fails to improve as anticipated.    Luetta Nutting, DO

## 2019-04-23 ENCOUNTER — Ambulatory Visit: Payer: Medicare Other | Attending: Internal Medicine

## 2019-04-23 DIAGNOSIS — Z20822 Contact with and (suspected) exposure to covid-19: Secondary | ICD-10-CM

## 2019-04-25 LAB — NOVEL CORONAVIRUS, NAA: SARS-CoV-2, NAA: DETECTED — AB

## 2019-04-29 ENCOUNTER — Other Ambulatory Visit: Payer: Self-pay

## 2019-04-29 ENCOUNTER — Telehealth: Payer: Self-pay

## 2019-04-29 ENCOUNTER — Telehealth (INDEPENDENT_AMBULATORY_CARE_PROVIDER_SITE_OTHER): Payer: Medicare Other | Admitting: Family Medicine

## 2019-04-29 ENCOUNTER — Encounter: Payer: Self-pay | Admitting: Family Medicine

## 2019-04-29 VITALS — BP 130/52 | Temp 97.0°F | Ht 59.0 in

## 2019-04-29 DIAGNOSIS — U071 COVID-19: Secondary | ICD-10-CM

## 2019-04-29 NOTE — Telephone Encounter (Signed)
Spoke with [name] the guardian regarding result and guidelines to follow.

## 2019-04-29 NOTE — Progress Notes (Signed)
Established Patient Office Visit  Subjective:  Patient ID: Abigail Webster, female    DOB: August 22, 1960  Age: 58 y.o. MRN: [id number]  CC:  Chief Complaint  Patient presents with  . advise    (spoke with Posey Rea) Pt currently not having any symptoms but she had cough, runny nose low grade fever positive for covid results in yesterday     HPI Abigail Webster presents for uri signs and symptoms since 12/16. Tested positive for Covid 19 virus on the 23.  She is doing well.  She had never developed significant symptoms past a runny nose.  Certainly is having no shortness of breath or dyspnea.  She did not develop a rash or diarrhea.  She is eating and drinking normally.  There is been no cough.  She lives in a group home.  She is accompanied by her caregiver today.  They have been exceedingly careful about the coming and going from the home.  Not certain about where Novie might of picked up the infection.    History reviewed. No pertinent past medical history.  History reviewed. No pertinent surgical history.  Family History  Problem Relation Age of Onset  . Pancreatic cancer Mother   . Hypertension Mother   . Hypothyroidism Mother   . Colon cancer Father   . Hypertension Father     Social History   Socioeconomic History  . Marital status: Single    Spouse name: Not on file  . Number of children: Not on file  . Years of education: Not on file  . Highest education level: Not on file  Occupational History  . Not on file  Tobacco Use  . Smoking status: Never Smoker  . Smokeless tobacco: Never Used  Substance and Sexual Activity  . Alcohol use: Not on file  . Drug use: Not on file  . Sexual activity: Not on file  Other Topics Concern  . Not on file  Social History Narrative  . Not on file   Social Determinants of Health   Financial Resource Strain:   . Difficulty of Paying Living Expenses: Not on file  Food Insecurity:   . Worried About Charity fundraiser in the  Last Year: Not on file  . Ran Out of Food in the Last Year: Not on file  Transportation Needs:   . Lack of Transportation (Medical): Not on file  . Lack of Transportation (Non-Medical): Not on file  Physical Activity:   . Days of Exercise per Week: Not on file  . Minutes of Exercise per Session: Not on file  Stress:   . Feeling of Stress : Not on file  Social Connections:   . Frequency of Communication with Friends and Family: Not on file  . Frequency of Social Gatherings with Friends and Family: Not on file  . Attends Religious Services: Not on file  . Active Member of Clubs or Organizations: Not on file  . Attends Archivist Meetings: Not on file  . Marital Status: Not on file  Intimate Partner Violence:   . Fear of Current or Ex-Partner: Not on file  . Emotionally Abused: Not on file  . Physically Abused: Not on file  . Sexually Abused: Not on file    Outpatient Medications Prior to Visit  Medication Sig Dispense Refill  . Cholecalciferol (VITAMIN D3) 50 MCG (2000 UT) capsule Take 1 capsule (2,000 Units total) by mouth daily. 30 capsule 11  . Emollient (EUCERIN EX) Apply  topically.    Marland Kitchen levothyroxine (SYNTHROID, LEVOTHROID) 50 MCG tablet TAKE (1) TABLET BY MOUTH ONCE DAILY IN THE MORNING 1 HOUR BEFORE EATING 90 tablet 4  . NONFORMULARY OR COMPOUNDED ITEM [facility]:  Antiinflammatory pain cream - Diclofenac 3%, Baclofen 2%, Lidocaine 2%, apply 1-2 grams to affected area 3-4 times daily 120 each 2  . RESTASIS 0.05 % ophthalmic emulsion      No facility-administered medications prior to visit.    No Known Allergies  ROS Review of Systems  Constitutional: Negative for chills, diaphoresis, fatigue, fever and unexpected weight change.  HENT: Negative for congestion, postnasal drip and sore throat.   Eyes: Positive for visual disturbance. Negative for photophobia.  Respiratory: Negative for cough, chest tightness and shortness of breath.   Cardiovascular:  Negative.   Gastrointestinal: Negative for abdominal pain and diarrhea.  Musculoskeletal: Negative for arthralgias and myalgias.  Neurological: Negative for headaches.  Psychiatric/Behavioral: Negative.       Objective:    Physical Exam  Constitutional: She appears well-developed and well-nourished. No distress.  HENT:  Head: Normocephalic and atraumatic.  Right Ear: External ear normal.  Left Ear: External ear normal.  Neck: No JVD present. No tracheal deviation present.  Pulmonary/Chest: Effort normal. No stridor.  Neurological: She is alert.  Skin: She is not diaphoretic.  Psychiatric: She has a normal mood and affect. Her behavior is normal.    BP (!) 130/52 Comment: per provider at the home ([name] Parker)  Temp (!) 97 F (36.1 C) (Tympanic)   Ht 4\' 11"  (1.499 m)   BMI 33.33 kg/m  Wt Readings from Last 3 Encounters:  04/07/19 165 lb (74.8 kg)  05/31/18 165 lb 8 oz (75.1 kg)  08/06/17 162 lb (73.5 kg)     Health Maintenance Due  Topic Date Due  . Hepatitis C Screening  25-Jan-1961  . HIV Screening  07/04/1975  . TETANUS/TDAP  07/04/1979  . PAP SMEAR-Modifier  07/03/1981    There are no preventive care reminders to display for this patient.  Lab Results  Component Value Date   TSH 2.09 05/31/2018   Lab Results  Component Value Date   WBC 6.1 05/31/2018   HGB 12.4 05/31/2018   HCT 36.5 05/31/2018   MCV 90.1 05/31/2018   PLT 276 05/31/2018   Lab Results  Component Value Date   NA 142 05/31/2018   K 4.6 05/31/2018   CO2 24 05/31/2018   GLUCOSE 86 05/31/2018   BUN 21 05/31/2018   CREATININE 1.13 (H) 05/31/2018   BILITOT 0.6 05/17/2017   ALKPHOS 88 05/17/2017   AST 17 05/17/2017   ALT 12 05/17/2017   PROT 6.8 05/17/2017   ALBUMIN 4.3 05/17/2017   CALCIUM 9.4 05/31/2018   GFR 57.90 (L) 05/17/2017   Lab Results  Component Value Date   CHOL 200 05/17/2017   Lab Results  Component Value Date   HDL 74.20 05/17/2017   Lab Results  Component  Value Date   LDLCALC 119 (H) 05/17/2017   Lab Results  Component Value Date   TRIG 37.0 05/17/2017   Lab Results  Component Value Date   CHOLHDL 3 05/17/2017   No results found for: HGBA1C    Assessment & Plan:   Problem List Items Addressed This Visit      Other   COVID-19 - Primary      No orders of the defined types were placed in this encounter.   Follow-up: No follow-ups on file.  Patient is recovering  well.  She will continue to self quarantine through the weekend.  She has follow-up scheduled with me next month.  Virtual Visit via Video Note  I connected with Casper Harrison on 04/29/19 at  1:00 PM EST by a video enabled telemedicine application and verified that I am speaking with the correct person using two identifiers.  Location: Patient: in her group home with her care giver.  Provider:    I discussed the limitations of evaluation and management by telemedicine and the availability of in person appointments. The patient expressed understanding and agreed to proceed.  History of Present Illness:    Observations/Objective:   Assessment and Plan:   Follow Up Instructions:    I discussed the assessment and treatment plan with the patient. The patient was provided an opportunity to ask questions and all were answered. The patient agreed with the plan and demonstrated an understanding of the instructions.   The patient was advised to call back or seek an in-person evaluation if the symptoms worsen or if the condition fails to improve as anticipated.  I provided 20 minutes of non-face-to-face time during this encounter.   [name], MD   [name], MD

## 2019-04-29 NOTE — Telephone Encounter (Signed)
Care giver Lewanda Rife) called to get clarification on Covid test results for pt. Informed her that a nurse would call her back. Pt voiced understanding.   Abigail Webster

## 2019-05-13 ENCOUNTER — Encounter: Payer: Medicare Other | Admitting: Family Medicine

## 2019-05-16 ENCOUNTER — Ambulatory Visit (INDEPENDENT_AMBULATORY_CARE_PROVIDER_SITE_OTHER): Payer: Medicare Other | Admitting: Family Medicine

## 2019-05-16 ENCOUNTER — Encounter: Payer: Self-pay | Admitting: Family Medicine

## 2019-05-16 ENCOUNTER — Other Ambulatory Visit: Payer: Self-pay

## 2019-05-16 VITALS — BP 140/84 | HR 57 | Temp 98.6°F | Wt 155.4 lb

## 2019-05-16 DIAGNOSIS — Z8616 Personal history of COVID-19: Secondary | ICD-10-CM | POA: Insufficient documentation

## 2019-05-16 DIAGNOSIS — F79 Unspecified intellectual disabilities: Secondary | ICD-10-CM | POA: Diagnosis not present

## 2019-05-16 DIAGNOSIS — Z1211 Encounter for screening for malignant neoplasm of colon: Secondary | ICD-10-CM | POA: Insufficient documentation

## 2019-05-16 DIAGNOSIS — E039 Hypothyroidism, unspecified: Secondary | ICD-10-CM | POA: Diagnosis not present

## 2019-05-16 DIAGNOSIS — B351 Tinea unguium: Secondary | ICD-10-CM

## 2019-05-16 DIAGNOSIS — Z1159 Encounter for screening for other viral diseases: Secondary | ICD-10-CM | POA: Diagnosis not present

## 2019-05-16 DIAGNOSIS — Z23 Encounter for immunization: Secondary | ICD-10-CM | POA: Diagnosis not present

## 2019-05-16 DIAGNOSIS — Z0000 Encounter for general adult medical examination without abnormal findings: Secondary | ICD-10-CM | POA: Diagnosis not present

## 2019-05-16 DIAGNOSIS — Z124 Encounter for screening for malignant neoplasm of cervix: Secondary | ICD-10-CM | POA: Insufficient documentation

## 2019-05-16 NOTE — Addendum Note (Signed)
Addended by: Marrion Coy on: 05/16/2019 04:16 PM   Modules accepted: Orders

## 2019-05-16 NOTE — Progress Notes (Signed)
Established Patient Office Visit  Subjective:  Patient ID: Abigail Webster, female    DOB: 1960/08/03  Age: 59 y.o. MRN: [id number]  CC:  Chief Complaint  Patient presents with  . Annual Exam    Patient is here today for a CPE.  She also has form that needs to be signed. Caregiver agrees to Hep-C lab draw. Denies any symptoms of thyroid issues. Agrees to get Tdap.    HPI Abigail Webster presents for follow-up of her recent COVID-19 infection.  She is doing well.  There is no longer any cough fever sore throat or runny nose.  She lives in a group setting.  Fortunately none of the other residents were affected.  Staff are regularly tested and have all been negative.  Continues levothyroxine for her hypothyroidism.  She was unable to go for colonoscopy because of the pandemic.  She has never had a Pap smear to her caregivers knowledge.  Patient's nails are thick and oncotic and difficult to trim.  Patient continues to do well mostly in the group home setting.  She is mentally challenged but is cooperative and generally responsive to directions.  No past medical history on file.  No past surgical history on file.  Family History  Problem Relation Age of Onset  . Pancreatic cancer Mother   . Hypertension Mother   . Hypothyroidism Mother   . Colon cancer Father   . Hypertension Father     Social History   Socioeconomic History  . Marital status: Single    Spouse name: Not on file  . Number of children: Not on file  . Years of education: Not on file  . Highest education level: Not on file  Occupational History  . Not on file  Tobacco Use  . Smoking status: Never Smoker  . Smokeless tobacco: Never Used  Substance and Sexual Activity  . Alcohol use: Not on file  . Drug use: Not on file  . Sexual activity: Not on file  Other Topics Concern  . Not on file  Social History Narrative  . Not on file   Social Determinants of Health   Financial Resource Strain:   . Difficulty of  Paying Living Expenses: Not on file  Food Insecurity:   . Worried About Charity fundraiser in the Last Year: Not on file  . Ran Out of Food in the Last Year: Not on file  Transportation Needs:   . Lack of Transportation (Medical): Not on file  . Lack of Transportation (Non-Medical): Not on file  Physical Activity:   . Days of Exercise per Week: Not on file  . Minutes of Exercise per Session: Not on file  Stress:   . Feeling of Stress : Not on file  Social Connections:   . Frequency of Communication with Friends and Family: Not on file  . Frequency of Social Gatherings with Friends and Family: Not on file  . Attends Religious Services: Not on file  . Active Member of Clubs or Organizations: Not on file  . Attends Archivist Meetings: Not on file  . Marital Status: Not on file  Intimate Partner Violence:   . Fear of Current or Ex-Partner: Not on file  . Emotionally Abused: Not on file  . Physically Abused: Not on file  . Sexually Abused: Not on file    Outpatient Medications Prior to Visit  Medication Sig Dispense Refill  . Cholecalciferol (VITAMIN D3) 50 MCG (2000 UT) capsule Take  1 capsule (2,000 Units total) by mouth daily. 30 capsule 11  . Emollient (EUCERIN EX) Apply topically.    Marland Kitchen levothyroxine (SYNTHROID, LEVOTHROID) 50 MCG tablet TAKE (1) TABLET BY MOUTH ONCE DAILY IN THE MORNING 1 HOUR BEFORE EATING 90 tablet 4  . RESTASIS 0.05 % ophthalmic emulsion     . NONFORMULARY OR COMPOUNDED ITEM [facility]:  Antiinflammatory pain cream - Diclofenac 3%, Baclofen 2%, Lidocaine 2%, apply 1-2 grams to affected area 3-4 times daily 120 each 2   No facility-administered medications prior to visit.    No Known Allergies  ROS Review of Systems  Constitutional: Negative for chills, diaphoresis, fatigue, fever and unexpected weight change.  HENT: Negative for congestion and postnasal drip.   Eyes: Positive for visual disturbance.  Respiratory: Negative for choking,  shortness of breath and wheezing.   Cardiovascular: Negative.   Gastrointestinal: Negative.   Endocrine: Negative for polyphagia and polyuria.  Genitourinary: Negative.   Musculoskeletal: Negative for gait problem and joint swelling.  Skin: Negative for pallor.  Allergic/Immunologic: Negative for immunocompromised state.  Neurological: Negative for light-headedness and headaches.  Hematological: Does not bruise/bleed easily.  Psychiatric/Behavioral: Negative.       Objective:    Physical Exam  Constitutional: She appears well-developed and well-nourished. No distress.  HENT:  Head: Normocephalic and atraumatic.  Right Ear: External ear normal.  Left Ear: External ear normal.  Eyes: Right eye exhibits no discharge. Left eye exhibits no discharge. No scleral icterus.  Neck: No JVD present. No tracheal deviation present.  Cardiovascular: Normal rate, regular rhythm and normal heart sounds.  Pulmonary/Chest: Effort normal and breath sounds normal. No stridor.  Abdominal: Bowel sounds are normal.  Musculoskeletal:        General: No edema.  Neurological: She is alert.  Skin: She is not diaphoretic.  Psychiatric: She has a normal mood and affect. Her behavior is normal.    BP 140/84 (BP Location: Right Arm, Patient Position: Sitting, Cuff Size: Normal)   Pulse (!) 57   Temp 98.6 F (37 C) (Oral)   Wt 155 lb 6.4 oz (70.5 kg)   SpO2 98%   BMI 31.39 kg/m  Wt Readings from Last 3 Encounters:  05/16/19 155 lb 6.4 oz (70.5 kg)  04/07/19 165 lb (74.8 kg)  05/31/18 165 lb 8 oz (75.1 kg)     Health Maintenance Due  Topic Date Due  . Hepatitis C Screening  Mar 13, 1961  . TETANUS/TDAP  07/04/1979    There are no preventive care reminders to display for this patient.  Lab Results  Component Value Date   TSH 2.09 05/31/2018   Lab Results  Component Value Date   WBC 6.1 05/31/2018   HGB 12.4 05/31/2018   HCT 36.5 05/31/2018   MCV 90.1 05/31/2018   PLT 276 05/31/2018    Lab Results  Component Value Date   NA 142 05/31/2018   K 4.6 05/31/2018   CO2 24 05/31/2018   GLUCOSE 86 05/31/2018   BUN 21 05/31/2018   CREATININE 1.13 (H) 05/31/2018   BILITOT 0.6 05/17/2017   ALKPHOS 88 05/17/2017   AST 17 05/17/2017   ALT 12 05/17/2017   PROT 6.8 05/17/2017   ALBUMIN 4.3 05/17/2017   CALCIUM 9.4 05/31/2018   GFR 57.90 (L) 05/17/2017   Lab Results  Component Value Date   CHOL 200 05/17/2017   Lab Results  Component Value Date   HDL 74.20 05/17/2017   Lab Results  Component Value Date   LDLCALC  119 (H) 05/17/2017   Lab Results  Component Value Date   TRIG 37.0 05/17/2017   Lab Results  Component Value Date   CHOLHDL 3 05/17/2017   No results found for: HGBA1C    Assessment & Plan:   Problem List Items Addressed This Visit      Endocrine   Acquired hypothyroidism   Relevant Orders   CBC   TSH     Musculoskeletal and Integument   Onychomycosis   Relevant Orders   Ambulatory referral to Podiatry     Other   Mentally challenged   Healthcare maintenance   Relevant Orders   CBC   Comp Met (CMET)   HIV antibody (with reflex)   Direct LDL   Screen for colon cancer   Relevant Orders   Ambulatory referral to Gastroenterology   Need for Tdap vaccination   Relevant Orders   Tdap vaccine greater than or equal to 7yo IM   Need for hepatitis C screening test - Primary   Relevant Orders   Hepatitis C Antibody   Screening for cervical cancer   Relevant Orders   Ambulatory referral to Obstetrics / Gynecology   History of COVID-19   Relevant Orders   SAR CoV2 Serology (COVID 19)AB(IGG)IA      No orders of the defined types were placed in this encounter.   Follow-up: Return in about 6 months (around 11/13/2019), or if symptoms worsen or fail to improve.  Reviewed and signed updated [place] 2 form.  [name], MD

## 2019-05-19 ENCOUNTER — Other Ambulatory Visit: Payer: Self-pay | Admitting: Family Medicine

## 2019-05-19 DIAGNOSIS — E039 Hypothyroidism, unspecified: Secondary | ICD-10-CM

## 2019-05-19 DIAGNOSIS — E559 Vitamin D deficiency, unspecified: Secondary | ICD-10-CM

## 2019-05-19 LAB — CBC
HCT: 34.9 % — ABNORMAL LOW (ref 35.0–45.0)
Hemoglobin: 11.8 g/dL (ref 11.7–15.5)
MCH: 30.6 pg (ref 27.0–33.0)
MCHC: 33.8 g/dL (ref 32.0–36.0)
MCV: 90.4 fL (ref 80.0–100.0)
MPV: 11.1 fL (ref 7.5–12.5)
Platelets: 258 10*3/uL (ref 140–400)
RBC: 3.86 10*6/uL (ref 3.80–5.10)
RDW: 13.3 % (ref 11.0–15.0)
WBC: 6.2 10*3/uL (ref 3.8–10.8)

## 2019-05-19 LAB — COMPREHENSIVE METABOLIC PANEL
AG Ratio: 1.6 (calc) (ref 1.0–2.5)
ALT: 9 U/L (ref 6–29)
AST: 15 U/L (ref 10–35)
Albumin: 4.1 g/dL (ref 3.6–5.1)
Alkaline phosphatase (APISO): 96 U/L (ref 37–153)
BUN/Creatinine Ratio: 17 (calc) (ref 6–22)
BUN: 19 mg/dL (ref 7–25)
CO2: 25 mmol/L (ref 20–32)
Calcium: 9.3 mg/dL (ref 8.6–10.4)
Chloride: 106 mmol/L (ref 98–110)
Creat: 1.11 mg/dL — ABNORMAL HIGH (ref 0.50–1.05)
Globulin: 2.6 g/dL (calc) (ref 1.9–3.7)
Glucose, Bld: 89 mg/dL (ref 65–99)
Potassium: 4.7 mmol/L (ref 3.5–5.3)
Sodium: 142 mmol/L (ref 135–146)
Total Bilirubin: 0.5 mg/dL (ref 0.2–1.2)
Total Protein: 6.7 g/dL (ref 6.1–8.1)

## 2019-05-19 LAB — HEPATITIS C ANTIBODY
Hepatitis C Ab: NONREACTIVE
SIGNAL TO CUT-OFF: 0.01 (ref <1.00)

## 2019-05-19 LAB — SAR COV2 SEROLOGY (COVID19)AB(IGG),IA: SARS CoV2 AB IGG: POSITIVE — AB

## 2019-05-19 LAB — LDL CHOLESTEROL, DIRECT: Direct LDL: 92 mg/dL (ref <100)

## 2019-05-19 LAB — HIV ANTIBODY (ROUTINE TESTING W REFLEX): HIV 1&2 Ab, 4th Generation: NONREACTIVE

## 2019-06-02 ENCOUNTER — Other Ambulatory Visit: Payer: Self-pay

## 2019-06-02 ENCOUNTER — Ambulatory Visit (INDEPENDENT_AMBULATORY_CARE_PROVIDER_SITE_OTHER): Payer: Medicare Other | Admitting: Podiatry

## 2019-06-02 ENCOUNTER — Encounter: Payer: Self-pay | Admitting: Podiatry

## 2019-06-02 DIAGNOSIS — M79675 Pain in left toe(s): Secondary | ICD-10-CM | POA: Diagnosis not present

## 2019-06-02 DIAGNOSIS — B351 Tinea unguium: Secondary | ICD-10-CM | POA: Diagnosis not present

## 2019-06-02 DIAGNOSIS — M79674 Pain in right toe(s): Secondary | ICD-10-CM | POA: Diagnosis not present

## 2019-06-02 NOTE — Progress Notes (Signed)
Complaint:  Visit Type: Patient returns to my office for continued preventative foot care services.  Patient states" my nails have grown long and thick and become painful to walk and wear shoes".    The patient presents for preventative foot care services. She presents to the office with her provider.  Podiatric Exam: Vascular: dorsalis pedis and posterior tibial pulses are palpable bilateral. Capillary return is immediate. Temperature gradient is WNL. Skin turgor WNL  Sensorium: Normal Semmes Weinstein monofilament test. Normal tactile sensation bilaterally. Nail Exam: Pt has thick disfigured discolored nails with subungual debris noted bilateral entire nail hallux through fifth toenails Ulcer Exam: There is no evidence of ulcer or pre-ulcerative changes or infection. Orthopedic Exam: Muscle tone and strength are WNL. No limitations in general ROM. No crepitus or effusions noted. Foot type and digits show no abnormalities. Bony prominences are unremarkable. Pes planus. Skin: No Porokeratosis. No infection or ulcers  Diagnosis:  Onychomycosis, , Pain in right toe, pain in left toes  Treatment & Plan Procedures and Treatment: Consent by patient was obtained for treatment procedures.   Debridement of mycotic and hypertrophic toenails, 1 through 5 bilateral and clearing of subungual debris. No ulceration, no infection noted.  Return Visit-Office Procedure: Patient instructed to return to the office for a follow up visit 3 months for continued evaluation and treatment.    Gardiner Barefoot DPM

## 2019-06-03 ENCOUNTER — Other Ambulatory Visit (INDEPENDENT_AMBULATORY_CARE_PROVIDER_SITE_OTHER): Payer: Medicare Other

## 2019-06-03 DIAGNOSIS — E039 Hypothyroidism, unspecified: Secondary | ICD-10-CM

## 2019-06-03 LAB — TSH: TSH: 1.23 u[IU]/mL (ref 0.35–4.50)

## 2019-07-08 NOTE — Progress Notes (Deleted)
Nurse connected with patient 07/09/19 at  8:45 AM EST by a telephone enabled telemedicine application and verified that I am speaking with the correct person using two identifiers. Patient stated full name and DOB. Patient gave permission to continue with virtual visit. Patient's location was at home and Nurse's location was at [facility] office  Subjective:   Abigail Webster is a 59 y.o. female who presents for an Initial Medicare Annual Wellness Visit.  Review of Systems   No ROS.  Medicare Wellness Virtual Visit.  Visual/audio telehealth visit, UTA vital signs.   See social history for additional risk factors.  Home Safety/Smoke Alarms: Feels safe in home. Smoke alarms in place.     Female:   Pap-       Mammo-         CCS-     Objective:    There were no vitals filed for this visit. There is no height or weight on file to calculate BMI.  No flowsheet data found.  Current Medications (verified) Outpatient Encounter Medications as of 07/09/2019  Medication Sig  . Cholecalciferol (VITAMIN D3) 50 MCG (2000 UT) capsule TAKE 1 CAPSULE BY MOUTH ONCE A DAY.  Marland Kitchen Emollient (EUCERIN EX) Apply topically.  Marland Kitchen levothyroxine (SYNTHROID) 50 MCG tablet TAKE (1) TABLET BY MOUTH ONCE DAILY IN THE MORNING 1 HOUR BEFORE EATING  . RESTASIS 0.05 % ophthalmic emulsion    No facility-administered encounter medications on file as of 07/09/2019.    Allergies (verified) Patient has no known allergies.   History: No past medical history on file. No past surgical history on file. Family History  Problem Relation Age of Onset  . Pancreatic cancer Mother   . Hypertension Mother   . Hypothyroidism Mother   . Colon cancer Father   . Hypertension Father    Social History   Socioeconomic History  . Marital status: Single    Spouse name: Not on file  . Number of children: Not on file  . Years of education: Not on file  . Highest education level: Not on file  Occupational History  . Not on file    Tobacco Use  . Smoking status: Never Smoker  . Smokeless tobacco: Never Used  Substance and Sexual Activity  . Alcohol use: Not on file  . Drug use: Not on file  . Sexual activity: Not on file  Other Topics Concern  . Not on file  Social History Narrative  . Not on file   Social Determinants of Health   Financial Resource Strain:   . Difficulty of Paying Living Expenses: Not on file  Food Insecurity:   . Worried About Charity fundraiser in the Last Year: Not on file  . Ran Out of Food in the Last Year: Not on file  Transportation Needs:   . Lack of Transportation (Medical): Not on file  . Lack of Transportation (Non-Medical): Not on file  Physical Activity:   . Days of Exercise per Week: Not on file  . Minutes of Exercise per Session: Not on file  Stress:   . Feeling of Stress : Not on file  Social Connections:   . Frequency of Communication with Friends and Family: Not on file  . Frequency of Social Gatherings with Friends and Family: Not on file  . Attends Religious Services: Not on file  . Active Member of Clubs or Organizations: Not on file  . Attends Archivist Meetings: Not on file  . Marital Status:  Not on file    Tobacco Counseling Counseling given: Not Answered   Clinical Intake:                        Activities of Daily Living In your present state of health, do you have any difficulty performing the following activities: 05/16/2019  Hearing? N  Vision? N  Difficulty concentrating or making decisions? Y  Comment has caregiver  Walking or climbing stairs? Y  Comment has assistance  Dressing or bathing? Y  Comment has a caregiver  Doing errands, shopping? Y  Comment has caregiver  Some recent data might be hidden     Immunizations and Health Maintenance Immunization History  Administered Date(s) Administered  . Influenza,inj,Quad PF,6+ Mos 05/17/2017, 02/12/2018, 01/08/2019  . Tdap 05/16/2019   Health Maintenance Due   Topic Date Due  . PAP SMEAR-Modifier  07/03/1981  . MAMMOGRAM  05/24/2019    Patient Care Team: [name], MD as PCP - General (Family Medicine)  Indicate any recent Medical Services you may have received from other than [facility] providers in the past year (date may be approximate).     Assessment:   This is a routine wellness examination for Abigail Webster. Physical assessment deferred to PCP.  Hearing/Vision screen No exam data present  Dietary issues and exercise activities discussed:   Diet (meal preparation, eat out, water intake, caffeinated beverages, dairy products, fruits and vegetables): {Desc; diets:16563} Breakfast: Lunch:  Dinner:      Goals   None    Depression Screen PHQ 2/9 Scores 04/29/2019  PHQ - 2 Score 0    Fall Risk Fall Risk  05/16/2019 05/16/2019 04/29/2019  Falls in the past year? 0 0 1  Number falls in past yr: - - 0  Injury with Fall? - - 0  Comment - - eyes swollen  Follow up - Falls evaluation completed -    Cognitive Function: Ad8 score reviewed for issues:  Issues making decisions:  Less interest in hobbies / activities:  Repeats questions, stories (family complaining):  Trouble using ordinary gadgets (microwave, computer, phone):  Forgets the month or year:   Mismanaging finances:   Remembering appts:  Daily problems with thinking and/or memory: Ad8 score is=            Screening Tests Health Maintenance  Topic Date Due  . PAP SMEAR-Modifier  07/03/1981  . MAMMOGRAM  05/24/2019  . COLONOSCOPY  05/02/2023  . TETANUS/TDAP  05/15/2029  . INFLUENZA VACCINE  Completed  . Hepatitis C Screening  Completed  . HIV Screening  Discontinued     Plan:   ***  I have personally reviewed and noted the following in the patient's chart:   . Medical and social history . Use of alcohol, tobacco or illicit drugs  . Current medications and supplements . Functional ability and status . Nutritional status . Physical  activity . Advanced directives . List of other physicians . Hospitalizations, surgeries, and ER visits in previous 12 months . Vitals . Screenings to include cognitive, depression, and falls . Referrals and appointments  In addition, I have reviewed and discussed with patient certain preventive protocols, quality metrics, and best practice recommendations. A written personalized care plan for preventive services as well as general preventive health recommendations were provided to patient.     Shela Nevin, [place]   07/08/2019

## 2019-07-09 ENCOUNTER — Ambulatory Visit: Payer: Medicare Other | Admitting: *Deleted

## 2019-07-18 ENCOUNTER — Encounter: Payer: Self-pay | Admitting: Family Medicine

## 2019-08-27 ENCOUNTER — Other Ambulatory Visit: Payer: Self-pay | Admitting: Family Medicine

## 2019-08-27 DIAGNOSIS — E039 Hypothyroidism, unspecified: Secondary | ICD-10-CM

## 2019-09-03 ENCOUNTER — Ambulatory Visit: Payer: Medicare Other | Admitting: Podiatry

## 2019-11-11 NOTE — Progress Notes (Signed)
Erroneous encounter

## 2019-11-12 ENCOUNTER — Ambulatory Visit: Payer: Medicare Other

## 2019-11-12 NOTE — Progress Notes (Signed)
Subjective:   Abigail Webster is a 59 y.o. female who presents for an Initial Medicare Annual Wellness Visit.  I connected with Abigail Webster today by telephone and verified that I am speaking with the correct person using two identifiers. Location patient: home Location provider: work Persons participating in the virtual visit: patient, nurse & caregiver([name] Research scientist (life sciences)).    I discussed the limitations, risks, security and privacy concerns of performing an evaluation and management service by telephone and the availability of in person appointments. I also discussed with the patient that there may be a patient responsible charge related to this service. The patient expressed understanding and verbally consented to this telephonic visit.    Interactive audio and video telecommunications were attempted between this provider and patient, however failed, due to patient having technical difficulties OR patient did not have access to video capability.  We continued and completed visit with audio only.  Some vital signs may be absent or patient reported.   Time Spent with patient on telephone encounter: 25 minutes  Review of Systems     Cardiac Risk Factors include: sedentary lifestyle;obesity (BMI >30kg/m2)     Objective:    Today's Vitals   11/13/19 1117  Weight: 155 lb (70.3 kg)  Height: 4\' 11"  (1.499 m)   Body mass index is 31.31 kg/m.  Advanced Directives 11/13/2019  Does Patient Have a Medical Advance Directive? Yes  Type of Paramedic of [place];Living will  Copy of [place] in Chart? No - copy requested    Current Medications (verified) Outpatient Encounter Medications as of 11/13/2019  Medication Sig  . Cholecalciferol (VITAMIN D3) 50 MCG (2000 UT) capsule TAKE 1 CAPSULE BY MOUTH ONCE A DAY.  Marland Kitchen Emollient (EUCERIN EX) Apply topically.  Marland Kitchen levothyroxine (SYNTHROID) 50 MCG tablet TAKE (1) TABLET BY MOUTH ONCE DAILY IN THE MORNING 1 HOUR  BEFORE EATING  . RESTASIS 0.05 % ophthalmic emulsion    No facility-administered encounter medications on file as of 11/13/2019.    Allergies (verified) Patient has no known allergies.   History: History reviewed. No pertinent past medical history. History reviewed. No pertinent surgical history. Family History  Problem Relation Age of Onset  . Pancreatic cancer Mother   . Hypertension Mother   . Hypothyroidism Mother   . Colon cancer Father   . Hypertension Father    Social History   Socioeconomic History  . Marital status: Single    Spouse name: Not on file  . Number of children: Not on file  . Years of education: Not on file  . Highest education level: Not on file  Occupational History  . Not on file  Tobacco Use  . Smoking status: Never Smoker  . Smokeless tobacco: Never Used  Substance and Sexual Activity  . Alcohol use: Never  . Drug use: Not on file  . Sexual activity: Not on file  Other Topics Concern  . Not on file  Social History Narrative  . Not on file   Social Determinants of Health   Financial Resource Strain: Low Risk   . Difficulty of Paying Living Expenses: Not hard at all  Food Insecurity:   . Worried About Charity fundraiser in the Last Year:   . Arboriculturist in the Last Year:   Transportation Needs: No Transportation Needs  . Lack of Transportation (Medical): No  . Lack of Transportation (Non-Medical): No  Physical Activity: Inactive  . Days of Exercise per Week:  0 days  . Minutes of Exercise per Session: 0 min  Stress:   . Feeling of Stress :   Social Connections:   . Frequency of Communication with Friends and Family:   . Frequency of Social Gatherings with Friends and Family:   . Attends Religious Services:   . Active Member of Clubs or Organizations:   . Attends Archivist Meetings:   Marland Kitchen Marital Status:     Tobacco Counseling Counseling given: Not Answered   Clinical Intake:  Pre-visit preparation completed:  Yes  Pain : No/denies pain     Nutritional Status: BMI > 30  Obese Nutritional Risks: None Diabetes: No  How often do you need to have someone help you when you read instructions, pamphlets, or other written materials from your doctor or pharmacy?: 5 - Always  Interpreter Needed?: No  Information entered by :: Caroleen Hamman LPN   Activities of Daily Living In your present state of health, do you have any difficulty performing the following activities: 11/13/2019 05/16/2019  Hearing? N N  Vision? Y N  Difficulty concentrating or making decisions? N Y  Comment - has caregiver  Walking or climbing stairs? N Y  Comment - has assistance  Dressing or bathing? [name]  Comment needs assistance has a caregiver  Doing errands, shopping? [name]  Comment requires assistance has caregiver  Preparing Food and eating ? Y -  Comment has assistance -  Using the Toilet? N -  In the past six months, have you accidently leaked urine? Y -  Do you have problems with loss of bowel control? N -  Managing your Medications? Y -  Managing your Finances? Y -  Housekeeping or managing your Housekeeping? Y -  Some recent data might be hidden     Immunizations and Health Maintenance Immunization History  Administered Date(s) Administered  . Influenza,inj,Quad PF,6+ Mos 05/17/2017, 02/12/2018, 01/08/2019  . PFIZER SARS-COV-2 Vaccination 07/24/2019, 08/14/2019  . Tdap 05/16/2019   Health Maintenance Due  Topic Date Due  . PAP SMEAR-Modifier  Never done  . MAMMOGRAM  05/24/2019    Patient Care Team: [name], MD as PCP - General (Family Medicine)  Indicate any recent Medical Services you may have received from other than [facility] providers in the past year (date may be approximate).     Assessment:   This is a routine wellness examination for [name].  Hearing/Vision screen  Hearing Screening   125Hz  250Hz  500Hz  1000Hz  2000Hz  3000Hz  4000Hz  6000Hz  8000Hz   Right ear:           Left  ear:           Comments: No hearing loss  Vision Screening Comments: Wears glasses Last eye exam-Unsure  Dietary issues and exercise activities discussed: Current Exercise Habits: The patient does not participate in regular exercise at present, Exercise limited by: psychological condition(s)  Goals Addressed            This Visit's Progress   . Patient Stated       Continue a healthy diet & walking as much as possible      Depression Screen PHQ 2/9 Scores 11/13/2019 04/29/2019  PHQ - 2 Score 0 0    Fall Risk Fall Risk  11/13/2019 05/16/2019 05/16/2019 04/29/2019  Falls in the past year? 1 0 0 1  Number falls in past yr: 0 - - 0  Injury with Fall? 0 - - 0  Comment - - - eyes swollen  Follow up Falls prevention discussed - Falls evaluation completed -   FALL RISK PREVENTION PERTAINING TO THE HOME:  Any stairs in or around the home? No  Home free of loose throw rugs in walkways, pet beds, electrical cords, etc? Yes  Adequate lighting in your home to reduce risk of falls? Yes   ASSISTIVE DEVICES UTILIZED TO PREVENT FALLS:  Life alert? No  Use of a cane, walker or w/c? Yes W/C for long distances only Grab bars in the bathroom? No  Shower chair or bench in shower? No  Elevated toilet seat or a handicapped toilet? No    DME ORDERS:  DME order needed?  No   TIMED UP AND GO:  Was the test performed? No . Virtual visit     Cognitive Function: Unable to complete MMSE or 6CIT. Caregiver assisted with answering questions.        Screening Tests Health Maintenance  Topic Date Due  . PAP SMEAR-Modifier  Never done  . MAMMOGRAM  05/24/2019  . INFLUENZA VACCINE  11/30/2019  . COLONOSCOPY  05/02/2023  . TETANUS/TDAP  05/15/2029  . COVID-19 Vaccine  Completed  . Hepatitis C Screening  Completed  . HIV Screening  Discontinued    Qualifies for Shingles Vaccine? Yes  Zostavax completed no. Due for Shingrix. Education has been provided regarding the importance of this  vaccine. Pt has been advised to call insurance company to determine out of pocket expense. Advised may also receive vaccine at local pharmacy or Health Dept. Verbalized acceptance and understanding.  Tdap:  Up to Date  Flu Vaccine: Due 12/2019  Pneumococcal Vaccine: Not indicated  Covid-19 Vaccine:  Completed vaccines  Cancer Screenings:  Colorectal Screening: Completed 05/01/2013. Repeat every 10 years  Mammogram: Completed 123/2019. Repeat every year;  Ordered today. Pt aware the office will call re: appt.  Bone Density: Not indicated  Lung Cancer Screening: (Low Dose CT Chest recommended if Age 37-80 years, 30 pack-year currently smoking OR have quit w/in 15years.) does not qualify.    Additional Screening:  Hepatitis C Screening: Completed 05/16/2019  Vision Screening: Recommended annual ophthalmology exams for early detection of glaucoma and other disorders of the eye. Is the patient up to date with their annual eye exam?  No  Who is the provider or what is the name of the office in which the pt attends annual eye exams? Unsure of name If pt is not established with a provider, would they like to be referred to a provider to establish care? No.  Dental Screening: Recommended annual dental exams for proper oral hygiene  Community Resource Referral:  CRR required this visit?  No       Plan:  I have personally reviewed and addressed the Medicare Annual Wellness questionnaire and have noted the following in the patient's chart:  A. Medical and social history B. Use of alcohol, tobacco or illicit drugs  C. Current medications and supplements D. Functional ability and status E.  Nutritional status F.  Physical activity G. Advance directives H. List of other physicians I.  Hospitalizations, surgeries, and ER visits in previous 12 months J.  [place] such as hearing and vision if needed, cognitive and depression L. Referrals and appointments   In addition, I  have reviewed and discussed with patient certain preventive protocols, quality metrics, and best practice recommendations. A written personalized care plan for preventive services as well as general preventive health recommendations were provided to patient.  Due to this being a  telephonic visit, the after visit summary with patients personalized plan was offered to patient via mail or my-chart. Patient would like to access on my-chart.   Signed,    Marta Antu, LPN   [id number]  Nurse Health Advisor   Nurse Notes: None

## 2019-11-13 ENCOUNTER — Ambulatory Visit (INDEPENDENT_AMBULATORY_CARE_PROVIDER_SITE_OTHER): Payer: Medicare Other

## 2019-11-13 VITALS — Ht 59.0 in | Wt 155.0 lb

## 2019-11-13 DIAGNOSIS — Z1231 Encounter for screening mammogram for malignant neoplasm of breast: Secondary | ICD-10-CM | POA: Diagnosis not present

## 2019-11-13 DIAGNOSIS — Z0000 Encounter for general adult medical examination without abnormal findings: Secondary | ICD-10-CM

## 2019-11-13 NOTE — Patient Instructions (Signed)
[name] , Thank you for taking time to come for your Medicare Wellness Visit. I appreciate your ongoing commitment to your health goals. Please review the following plan we discussed and let me know if I can assist you in the future.   Screening recommendations/referrals: Colonoscopy: Completed 05/01/2013-Due 05/02/2023 Mammogram: Completed 05/23/2017-Ordered today. Someone will be calling to schedule appointment. Bone Density: Not indicated Recommended yearly ophthalmology/optometry visit for glaucoma screening and checkup Recommended yearly dental visit for hygiene and checkup  Vaccinations: Influenza vaccine: Due 12/2019 Pneumococcal vaccine: Not indicated Tdap vaccine: Up to Date-Due 05/15/2029 Shingles vaccine: Discuss with pharmacy Covid-19: Completed vaccines  Advanced directives: Please bring a copy to next office visit if available  Conditions/risks identified: See problem list  Next appointment: Follow up in one year for your annual wellness visit.   Preventive Care 40-64 Years, Female Preventive care refers to lifestyle choices and visits with your health care provider that can promote health and wellness. What does preventive care include?  A yearly physical exam. This is also called an annual well check.  Dental exams once or twice a year.  Routine eye exams. Ask your health care provider how often you should have your eyes checked.  Personal lifestyle choices, including:  Daily care of your teeth and gums.  Regular physical activity.  Eating a healthy diet.  Avoiding tobacco and drug use.  Limiting alcohol use.  Practicing safe sex.  Taking low-dose aspirin daily starting at age 50.  Taking vitamin and mineral supplements as recommended by your health care provider. What happens during an annual well check? The services and screenings done by your health care provider during your annual well check will depend on your age, overall health, lifestyle risk  factors, and family history of disease. Counseling  Your health care provider may ask you questions about your:  Alcohol use.  Tobacco use.  Drug use.  Emotional well-being.  Home and relationship well-being.  Sexual activity.  Eating habits.  Work and work Statistician.  Method of birth control.  Menstrual cycle.  Pregnancy history. Screening  You may have the following tests or measurements:  Height, weight, and BMI.  Blood pressure.  Lipid and cholesterol levels. These may be checked every 5 years, or more frequently if you are over 51 years old.  Skin check.  Lung cancer screening. You may have this screening every year starting at age 103 if you have a 30-pack-year history of smoking and currently smoke or have quit within the past 15 years.  Fecal occult blood test (FOBT) of the stool. You may have this test every year starting at age 84.  Flexible sigmoidoscopy or colonoscopy. You may have a sigmoidoscopy every 5 years or a colonoscopy every 10 years starting at age 37.  Hepatitis C blood test.  Hepatitis B blood test.  Sexually transmitted disease (STD) testing.  Diabetes screening. This is done by checking your blood sugar (glucose) after you have not eaten for a while (fasting). You may have this done every 1-3 years.  Mammogram. This may be done every 1-2 years. Talk to your health care provider about when you should start having regular mammograms. This may depend on whether you have a family history of breast cancer.  BRCA-related cancer screening. This may be done if you have a family history of breast, ovarian, tubal, or peritoneal cancers.  Pelvic exam and Pap test. This may be done every 3 years starting at age 58. Starting at age 77, this 1  be done every 5 years if you have a Pap test in combination with an HPV test.  Bone density scan. This is done to screen for osteoporosis. You may have this scan if you are at high risk for  osteoporosis. Discuss your test results, treatment options, and if necessary, the need for more tests with your health care provider. Vaccines  Your health care provider may recommend certain vaccines, such as:  Influenza vaccine. This is recommended every year.  Tetanus, diphtheria, and acellular pertussis (Tdap, Td) vaccine. You may need a Td booster every 10 years.  Zoster vaccine. You may need this after age 78.  Pneumococcal 13-valent conjugate (PCV13) vaccine. You may need this if you have certain conditions and were not previously vaccinated.  Pneumococcal polysaccharide (PPSV23) vaccine. You may need one or two doses if you smoke cigarettes or if you have certain conditions. Talk to your health care provider about which screenings and vaccines you need and how often you need them. This information is not intended to replace advice given to you by your health care provider. Make sure you discuss any questions you have with your health care provider. Document Released: 05/14/2015 Document Revised: 01/05/2016 Document Reviewed: 02/16/2015 Elsevier Interactive Patient Education  2017 [place] Prevention in the Home Falls can cause injuries. They can happen to people of all ages. There are many things you can do to make your home safe and to help prevent falls. What can I do on the outside of my home?  Regularly fix the edges of walkways and driveways and fix any cracks.  Remove anything that might make you trip as you walk through a door, such as a raised step or threshold.  Trim any bushes or trees on the path to your home.  Use bright outdoor lighting.  Clear any walking paths of anything that might make someone trip, such as rocks or tools.  Regularly check to see if handrails are loose or broken. Make sure that both sides of any steps have handrails.  Any raised decks and porches should have guardrails on the edges.  Have any leaves, snow, or ice cleared  regularly.  Use sand or salt on walking paths during winter.  Clean up any spills in your garage right away. This includes oil or grease spills. What can I do in the bathroom?  Use night lights.  Install grab bars by the toilet and in the tub and shower. Do not use towel bars as grab bars.  Use non-skid mats or decals in the tub or shower.  If you need to sit down in the shower, use a plastic, non-slip stool.  Keep the floor dry. Clean up any water that spills on the floor as soon as it happens.  Remove soap buildup in the tub or shower regularly.  Attach bath mats securely with double-sided non-slip rug tape.  Do not have throw rugs and other things on the floor that can make you trip. What can I do in the bedroom?  Use night lights.  Make sure that you have a light by your bed that is easy to reach.  Do not use any sheets or blankets that are too big for your bed. They should not hang down onto the floor.  Have a firm chair that has side arms. You can use this for support while you get dressed.  Do not have throw rugs and other things on the floor that can make you trip.  What can I do in the kitchen?  Clean up any spills right away.  Avoid walking on wet floors.  Keep items that you use a lot in easy-to-reach places.  If you need to reach something above you, use a strong step stool that has a grab bar.  Keep electrical cords out of the way.  Do not use floor polish or wax that makes floors slippery. If you must use wax, use non-skid floor wax.  Do not have throw rugs and other things on the floor that can make you trip. What can I do with my stairs?  Do not leave any items on the stairs.  Make sure that there are handrails on both sides of the stairs and use them. Fix handrails that are broken or loose. Make sure that handrails are as long as the stairways.  Check any carpeting to make sure that it is firmly attached to the stairs. Fix any carpet that is loose  or worn.  Avoid having throw rugs at the top or bottom of the stairs. If you do have throw rugs, attach them to the floor with carpet tape.  Make sure that you have a light switch at the top of the stairs and the bottom of the stairs. If you do not have them, ask someone to add them for you. What else can I do to help prevent falls?  Wear shoes that:  Do not have high heels.  Have rubber bottoms.  Are comfortable and fit you well.  Are closed at the toe. Do not wear sandals.  If you use a stepladder:  Make sure that it is fully opened. Do not climb a closed stepladder.  Make sure that both sides of the stepladder are locked into place.  Ask someone to hold it for you, if possible.  Clearly mark and make sure that you can see:  Any grab bars or handrails.  First and last steps.  Where the edge of each step is.  Use tools that help you move around (mobility aids) if they are needed. These include:  Canes.  Walkers.  Scooters.  Crutches.  Turn on the lights when you go into a dark area. Replace any light bulbs as soon as they burn out.  Set up your furniture so you have a clear path. Avoid moving your furniture around.  If any of your floors are uneven, fix them.  If there are any pets around you, be aware of where they are.  Review your medicines with your doctor. Some medicines can make you feel dizzy. This can increase your chance of falling. Ask your doctor what other things that you can do to help prevent falls. This information is not intended to replace advice given to you by your health care provider. Make sure you discuss any questions you have with your health care provider. Document Released: 02/11/2009 Document Revised: 09/23/2015 Document Reviewed: 05/22/2014 Elsevier Interactive Patient Education  2017 [employer].

## 2020-01-21 ENCOUNTER — Ambulatory Visit (INDEPENDENT_AMBULATORY_CARE_PROVIDER_SITE_OTHER): Payer: Medicare Other | Admitting: Podiatry

## 2020-01-21 ENCOUNTER — Encounter: Payer: Self-pay | Admitting: Podiatry

## 2020-01-21 ENCOUNTER — Other Ambulatory Visit: Payer: Self-pay

## 2020-01-21 DIAGNOSIS — B351 Tinea unguium: Secondary | ICD-10-CM

## 2020-01-21 DIAGNOSIS — M79674 Pain in right toe(s): Secondary | ICD-10-CM | POA: Diagnosis not present

## 2020-01-21 DIAGNOSIS — M79675 Pain in left toe(s): Secondary | ICD-10-CM

## 2020-01-21 NOTE — Progress Notes (Signed)
Complaint:  Visit Type: Patient returns to my office for continued preventative foot care services.  Patient states" my nails have grown long and thick and become painful to walk and wear shoes".    The patient presents for preventative foot care services. She presents to the office with her caregiver.    Podiatric Exam: Vascular: dorsalis pedis and posterior tibial pulses are palpable bilateral. Capillary return is immediate. Temperature gradient is WNL. Skin turgor WNL  Sensorium: Normal Semmes Weinstein monofilament test. Normal tactile sensation bilaterally. Nail Exam: Pt has thick disfigured discolored nails with subungual debris noted bilateral entire nail hallux through fifth toenails Ulcer Exam: There is no evidence of ulcer or pre-ulcerative changes or infection. Orthopedic Exam: Muscle tone and strength are WNL. No limitations in general ROM. No crepitus or effusions noted. Foot type and digits show no abnormalities. Bony prominences are unremarkable. Pes planus. Skin: No Porokeratosis. No infection or ulcers.  Pinch callus right asymptomatic.  Diagnosis:  Onychomycosis, , Pain in right toe, pain in left toes  Treatment & Plan Procedures and Treatment: Consent by patient was obtained for treatment procedures.   Debridement of mycotic and hypertrophic toenails, 1 through 5 bilateral and clearing of subungual debris using nail nipper followed by dremel.. No ulceration, no infection noted.  Return Visit-Office Procedure: Patient instructed to return to the office for a follow up visit 3 months for continued evaluation and treatment.    Jkwon Treptow DPM 

## 2020-02-12 ENCOUNTER — Other Ambulatory Visit: Payer: Self-pay

## 2020-02-13 ENCOUNTER — Encounter: Payer: Self-pay | Admitting: Family Medicine

## 2020-02-13 ENCOUNTER — Ambulatory Visit (INDEPENDENT_AMBULATORY_CARE_PROVIDER_SITE_OTHER): Payer: Medicare Other | Admitting: Family Medicine

## 2020-02-13 VITALS — BP 118/68 | HR 51 | Temp 97.5°F | Ht 59.0 in | Wt 157.8 lb

## 2020-02-13 DIAGNOSIS — F79 Unspecified intellectual disabilities: Secondary | ICD-10-CM | POA: Diagnosis not present

## 2020-02-13 DIAGNOSIS — Z23 Encounter for immunization: Secondary | ICD-10-CM | POA: Diagnosis not present

## 2020-02-13 DIAGNOSIS — E039 Hypothyroidism, unspecified: Secondary | ICD-10-CM

## 2020-02-13 DIAGNOSIS — H5440 Blindness, one eye, unspecified eye: Secondary | ICD-10-CM | POA: Diagnosis not present

## 2020-02-13 LAB — TSH: TSH: 1.36 u[IU]/mL (ref 0.35–4.50)

## 2020-02-13 NOTE — Progress Notes (Signed)
Established Patient Office Visit  Subjective:  Patient ID: Abigail Webster, female    DOB: 06-17-1960  Age: 59 y.o. MRN: [id number]  CC:  Chief Complaint  Patient presents with  . Follow-up    6 month follow up, no concerns. Would like flu shot.     HPI Abigail Webster presents for follow-up of hypothyroidism.  She is accompanied by her caregiver tells me that she seems to be having some problems with depth perception.  She has had problems negotiating stairs at times and tends to bump into things.  She is seeing ophthalmology in a few weeks.  Continues to see podiatry for her toenails.  Has been taking her thyroid medicine first thing in the morning.  No past medical history on file.  No past surgical history on file.  Family History  Problem Relation Age of Onset  . Pancreatic cancer Mother   . Hypertension Mother   . Hypothyroidism Mother   . Colon cancer Father   . Hypertension Father     Social History   Socioeconomic History  . Marital status: Single    Spouse name: Not on file  . Number of children: Not on file  . Years of education: Not on file  . Highest education level: Not on file  Occupational History  . Not on file  Tobacco Use  . Smoking status: Never Smoker  . Smokeless tobacco: Never Used  Substance and Sexual Activity  . Alcohol use: Never  . Drug use: Not on file  . Sexual activity: Not on file  Other Topics Concern  . Not on file  Social History Narrative  . Not on file   Social Determinants of Health   Financial Resource Strain: Low Risk   . Difficulty of Paying Living Expenses: Not hard at all  Food Insecurity:   . Worried About Charity fundraiser in the Last Year: Not on file  . Ran Out of Food in the Last Year: Not on file  Transportation Needs: No Transportation Needs  . Lack of Transportation (Medical): No  . Lack of Transportation (Non-Medical): No  Physical Activity: Inactive  . Days of Exercise per Week: 0 days  . Minutes of  Exercise per Session: 0 min  Stress:   . Feeling of Stress : Not on file  Social Connections:   . Frequency of Communication with Friends and Family: Not on file  . Frequency of Social Gatherings with Friends and Family: Not on file  . Attends Religious Services: Not on file  . Active Member of Clubs or Organizations: Not on file  . Attends Archivist Meetings: Not on file  . Marital Status: Not on file  Intimate Partner Violence:   . Fear of Current or Ex-Partner: Not on file  . Emotionally Abused: Not on file  . Physically Abused: Not on file  . Sexually Abused: Not on file    Outpatient Medications Prior to Visit  Medication Sig Dispense Refill  . Cholecalciferol (VITAMIN D3) 50 MCG (2000 UT) capsule TAKE 1 CAPSULE BY MOUTH ONCE A DAY. 30 capsule 11  . Emollient (EUCERIN EX) Apply topically.    Marland Kitchen levothyroxine (SYNTHROID) 50 MCG tablet TAKE (1) TABLET BY MOUTH ONCE DAILY IN THE MORNING 1 HOUR BEFORE EATING 30 tablet 11  . RESTASIS 0.05 % ophthalmic emulsion      No facility-administered medications prior to visit.    No Known Allergies  ROS Review of Systems  Constitutional: Negative.  HENT: Negative.   Eyes: Positive for visual disturbance.  Respiratory: Negative.   Cardiovascular: Negative.   Gastrointestinal: Negative.   Genitourinary: Negative.   Allergic/Immunologic: Negative for immunocompromised state.  Hematological: Does not bruise/bleed easily.      Objective:    Physical Exam Vitals and nursing note reviewed.  Constitutional:      General: She is not in acute distress.    Appearance: Normal appearance. She is not ill-appearing, toxic-appearing or diaphoretic.  HENT:     Head: Normocephalic and atraumatic.     Right Ear: External ear normal.     Left Ear: External ear normal.     Mouth/Throat:     Mouth: Mucous membranes are moist.     Pharynx: Oropharynx is clear.  Eyes:     Conjunctiva/sclera:     Left eye: Left conjunctiva is not  injected. No chemosis or hemorrhage. Cardiovascular:     Rate and Rhythm: Normal rate and regular rhythm.  Pulmonary:     Effort: Pulmonary effort is normal.  Skin:    General: Skin is warm and dry.  Neurological:     Mental Status: She is alert.  Psychiatric:        Mood and Affect: Mood normal.        Behavior: Behavior normal.     BP 118/68   Pulse (!) 51   Temp (!) 97.5 F (36.4 C) (Tympanic)   Ht 4\' 11"  (1.499 m)   Wt 157 lb 12.8 oz (71.6 kg)   SpO2 96%   BMI 31.87 kg/m  Wt Readings from Last 3 Encounters:  02/13/20 157 lb 12.8 oz (71.6 kg)  11/13/19 155 lb (70.3 kg)  05/16/19 155 lb 6.4 oz (70.5 kg)     Health Maintenance Due  Topic Date Due  . PAP SMEAR-Modifier  Never done  . MAMMOGRAM  05/24/2019    There are no preventive care reminders to display for this patient.  Lab Results  Component Value Date   TSH 1.23 06/03/2019   Lab Results  Component Value Date   WBC 6.2 05/16/2019   HGB 11.8 05/16/2019   HCT 34.9 (L) 05/16/2019   MCV 90.4 05/16/2019   PLT 258 05/16/2019   Lab Results  Component Value Date   NA 142 05/16/2019   K 4.7 05/16/2019   CO2 25 05/16/2019   GLUCOSE 89 05/16/2019   BUN 19 05/16/2019   CREATININE 1.11 (H) 05/16/2019   BILITOT 0.5 05/16/2019   ALKPHOS 88 05/17/2017   AST 15 05/16/2019   ALT 9 05/16/2019   PROT 6.7 05/16/2019   ALBUMIN 4.3 05/17/2017   CALCIUM 9.3 05/16/2019   GFR 57.90 (L) 05/17/2017   Lab Results  Component Value Date   CHOL 200 05/17/2017   Lab Results  Component Value Date   HDL 74.20 05/17/2017   Lab Results  Component Value Date   LDLCALC 119 (H) 05/17/2017   Lab Results  Component Value Date   TRIG 37.0 05/17/2017   Lab Results  Component Value Date   CHOLHDL 3 05/17/2017   No results found for: HGBA1C    Assessment & Plan:   Problem List Items Addressed This Visit      Endocrine   Acquired hypothyroidism   Relevant Orders   TSH     Other   Mentally challenged    Blind right eye    Other Visit Diagnoses    Need for influenza vaccination    -  Primary  Relevant Orders   Flu Vaccine QUAD 6+ mos PF IM (Fluarix Quad PF) (Completed)      No orders of the defined types were placed in this encounter.   Follow-up: Return in about 6 months (around 08/13/2020).    Abigail Maw, MD

## 2020-02-16 MED ORDER — LEVOTHYROXINE SODIUM 50 MCG PO TABS: ORAL_TABLET | ORAL | 3 refills | Status: DC

## 2020-04-21 ENCOUNTER — Ambulatory Visit (INDEPENDENT_AMBULATORY_CARE_PROVIDER_SITE_OTHER): Payer: Medicare Other | Admitting: Podiatry

## 2020-04-21 ENCOUNTER — Other Ambulatory Visit: Payer: Self-pay

## 2020-04-21 ENCOUNTER — Encounter: Payer: Self-pay | Admitting: Podiatry

## 2020-04-21 DIAGNOSIS — M79675 Pain in left toe(s): Secondary | ICD-10-CM

## 2020-04-21 DIAGNOSIS — M79674 Pain in right toe(s): Secondary | ICD-10-CM

## 2020-04-21 DIAGNOSIS — B351 Tinea unguium: Secondary | ICD-10-CM

## 2020-04-21 NOTE — Progress Notes (Signed)
Complaint:  Visit Type: Patient returns to my office for continued preventative foot care services.  Patient states" my nails have grown long and thick and become painful to walk and wear shoes".    The patient presents for preventative foot care services. She presents to the office with her caregiver.    Podiatric Exam: Vascular: dorsalis pedis and posterior tibial pulses are palpable bilateral. Capillary return is immediate. Temperature gradient is WNL. Skin turgor WNL  Sensorium: Normal Semmes Weinstein monofilament test. Normal tactile sensation bilaterally. Nail Exam: Pt has thick disfigured discolored nails with subungual debris noted bilateral entire nail hallux through fifth toenails Ulcer Exam: There is no evidence of ulcer or pre-ulcerative changes or infection. Orthopedic Exam: Muscle tone and strength are WNL. No limitations in general ROM. No crepitus or effusions noted. Foot type and digits show no abnormalities. Bony prominences are unremarkable. Pes planus. Skin: No Porokeratosis. No infection or ulcers.  Pinch callus right asymptomatic.  Diagnosis:  Onychomycosis, , Pain in right toe, pain in left toes  Treatment & Plan Procedures and Treatment: Consent by patient was obtained for treatment procedures.   Debridement of mycotic and hypertrophic toenails, 1 through 5 bilateral and clearing of subungual debris using nail nipper followed by dremel.. No ulceration, no infection noted.  Return Visit-Office Procedure: Patient instructed to return to the office for a follow up visit 3 months for continued evaluation and treatment.    Diane Hanel DPM 

## 2020-05-15 ENCOUNTER — Other Ambulatory Visit: Payer: Self-pay | Admitting: Family Medicine

## 2020-05-15 DIAGNOSIS — E559 Vitamin D deficiency, unspecified: Secondary | ICD-10-CM

## 2020-05-18 ENCOUNTER — Telehealth: Payer: Medicare Other | Admitting: Nurse Practitioner

## 2020-06-04 ENCOUNTER — Encounter: Payer: Medicare Other | Admitting: Family Medicine

## 2020-06-07 ENCOUNTER — Encounter: Payer: Self-pay | Admitting: Family Medicine

## 2020-06-07 ENCOUNTER — Ambulatory Visit (INDEPENDENT_AMBULATORY_CARE_PROVIDER_SITE_OTHER): Payer: Medicare Other | Admitting: Family Medicine

## 2020-06-07 ENCOUNTER — Other Ambulatory Visit: Payer: Self-pay

## 2020-06-07 VITALS — BP 124/80 | HR 67 | Temp 98.5°F | Ht 60.0 in | Wt 156.4 lb

## 2020-06-07 DIAGNOSIS — E559 Vitamin D deficiency, unspecified: Secondary | ICD-10-CM | POA: Diagnosis not present

## 2020-06-07 DIAGNOSIS — F79 Unspecified intellectual disabilities: Secondary | ICD-10-CM

## 2020-06-07 DIAGNOSIS — R4689 Other symptoms and signs involving appearance and behavior: Secondary | ICD-10-CM

## 2020-06-07 DIAGNOSIS — Z800 Family history of malignant neoplasm of digestive organs: Secondary | ICD-10-CM | POA: Insufficient documentation

## 2020-06-07 DIAGNOSIS — E039 Hypothyroidism, unspecified: Secondary | ICD-10-CM

## 2020-06-07 DIAGNOSIS — R32 Unspecified urinary incontinence: Secondary | ICD-10-CM

## 2020-06-07 LAB — VITAMIN D 25 HYDROXY (VIT D DEFICIENCY, FRACTURES): VITD: 50.74 ng/mL (ref 30.00–100.00)

## 2020-06-07 LAB — TSH: TSH: 2.67 u[IU]/mL (ref 0.35–4.50)

## 2020-06-07 NOTE — Progress Notes (Signed)
[facility] [address] [place] [address] Dept: [phone] Dept Fax: [phone]  Acute Office Visit  Subjective:    Patient ID: Abigail Webster, female    DOB: 17-Dec-1960, 60 y.o..   MRN: [id number]  Chief Complaint  Patient presents with  . urine incontinence    Urine incontinence, behavior issues patient spitting on the floor a lot being mean to peers not acting herself.     History of Present Illness:  Patient is in today for with [name], the manager of her group home ([facility]big[facility]). [name] has been a resident there for about 4 years, since the death of her mother. She has a history of a pervasive developmental disorder since childhood.  [name] notes that since Dec., [name] has had an issue with urinary incontinence, esp. at night. Previously, she would have an episode of incontinence on rare occasions. However, now she is having nocturnal enuresis on 4 out of 7 nights. They have had her using adult pull-ups to try and help. She has not had fever and has not indicated any Webster with urination.  [name] also notes concerns related to behavioral changes in [name]. [name] sleeps sitting up in a chair and places a blanket over her head. She always has her TV on, though at night at low volumes. They have had growing issue with Ms. Whittinghill talking to herself through much of the night and not sleeping. The talking has been getting louder with time and is disturbing to other residents. She will respond when staff speaks to her about this,and apologize, but later return to the same behavior. She does not sleep much during the day. [name] does admit that [name] seems to be having a decline in vision in her left eye. She is seeing an eye doctor, who is working with her with corrective lenses. [name] has had some old injury or disease that completely obscures and blinds the right eye.  [name]  states she wonders if [name] is going through menopause. She notes a family member told the staff [name] was sterilized when she was younger. It is not clear as to whether this was by tubal ligation, hysterectomy, or other. [name] notes it is rare for [name] to have menstrual bleeding.  Past Medical History: Patient Active Problem List   Diagnosis Date Noted  . Family history of malignant neoplasm of gastrointestinal tract 06/07/2020  . Webster due to onychomycosis of toenails of both feet 06/02/2019  . Screen for colon cancer 05/16/2019  . Need for Tdap vaccination 05/16/2019  . Need for hepatitis C screening test 05/16/2019  . Screening for cervical cancer 05/16/2019  . History of COVID-19 05/16/2019  . COVID-19 04/29/2019  . Acquired hypothyroidism 05/28/2017  . Onychomycosis 05/17/2017  . 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  . Mentally challenged 05/16/2017  . Healthcare maintenance 05/16/2017  . 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 Vitamin D deficiency 05/16/2017  . Xerosis cutis 05/16/2017   Past Surgical History:  Procedure Laterality Date  . FOOT SURGERY     Family History  Problem Relation Age of Onset  . Pancreatic cancer Mother   . Hypertension Mother   . Hypothyroidism Mother   . Colon cancer Father   . Hypertension Father    Outpatient Medications Prior to Visit  Medication Sig Dispense Refill  . Cholecalciferol (VITAMIN D3) 50 MCG (2000 UT) capsule TAKE 1 CAPSULE BY MOUTH  ONCE A DAY. 30 capsule 11  . Emollient (EUCERIN EX) Apply topically.    . Ergocalciferol (VITAMIN D2 PO)     . levothyroxine (SYNTHROID) 50 MCG tablet Take one each morning on a fasting stomach 1 hour prior to eating breakfast. 100 tablet 3  . RESTASIS 0.05 % ophthalmic emulsion     . AMOXICILLIN PO  (Patient not taking: Reported on 06/07/2020)     No facility-administered medications prior to visit.   No Known Allergies    Objective:   Today's Vitals   06/07/20 1111  BP: 124/80  Pulse: 67   Temp: 98.5 F (36.9 C)  TempSrc: Temporal  SpO2: 97%  Weight: 156 lb 6.4 oz (70.9 kg)  Height: 5' (1.524 m)   Body mass index is 30.54 kg/m.   General: Appears well nourished. Sitting up in chair, but rarely makes eye contact. No acute distress. Neuro: Speech is mostly understandable and [name] can answer some simple questions.  Health Maintenance Due  Topic Date Due  . PAP SMEAR-Modifier  Never done  . MAMMOGRAM  05/24/2019     Assessment & Plan:   1. Mentally challenged [name]' developmental disorder makes her a poor historian. Her caretakers are critical for providing history.  2. Incontinence in female I recommend a UA to assess for possible infectious etiology of her incontinence. Will also lookf for any sign of glucosuria. In light of the questionable history of "sterilization" technique. I will check an LH/FSH to assess for ovarian function. Ultimately, she likely needs a urology referral to assess for potential etiologies of urinary incontinence and to develop a treatment plan.  - FSH/LH - Urinalysis, Routine w reflex microscopic  3. Acquired hypothyroidism We will check TSH to monitor adequacy of levothyroxine replacement.  - TSH  4. Vitamin D deficiency We will check a Vit D level to assess adequacy of replacement.  - VITAMIN D 25 Hydroxy (Vit-D Deficiency, Fractures)  5. Behavioral change It is unclear if Ms. Baldridge' behaviors could potentially represent an overlay of dementia with her pervasive developmental issues. I recommend referral to neurology for a dementia assessment and guidance for the Group Home staff for working with her behavioral issues.  - Ambulatory referral to Neurology  ADDENDUM: UA was normal. Will place order for urology referral.  Haydee Salter, MD

## 2020-06-08 ENCOUNTER — Encounter: Payer: Self-pay | Admitting: Family Medicine

## 2020-06-08 DIAGNOSIS — Z780 Asymptomatic menopausal state: Secondary | ICD-10-CM | POA: Insufficient documentation

## 2020-06-08 LAB — URINALYSIS, ROUTINE W REFLEX MICROSCOPIC
Bilirubin Urine: NEGATIVE
Hgb urine dipstick: NEGATIVE
Ketones, ur: NEGATIVE
Leukocytes,Ua: NEGATIVE
Nitrite: NEGATIVE
RBC / HPF: NONE SEEN (ref 0–?)
Specific Gravity, Urine: 1.025 (ref 1.000–1.030)
Total Protein, Urine: NEGATIVE
Urine Glucose: NEGATIVE
Urobilinogen, UA: 0.2 (ref 0.0–1.0)
pH: 5.5 (ref 5.0–8.0)

## 2020-06-08 LAB — FSH/LH
FSH: 89.6 m[IU]/mL
LH: 45.2 m[IU]/mL

## 2020-06-08 NOTE — Addendum Note (Signed)
Addended by: Haydee Salter on: 06/08/2020 08:56 AM   Modules accepted: Orders

## 2020-06-30 ENCOUNTER — Encounter: Payer: Self-pay | Admitting: Podiatry

## 2020-06-30 ENCOUNTER — Other Ambulatory Visit: Payer: Self-pay

## 2020-06-30 ENCOUNTER — Ambulatory Visit (INDEPENDENT_AMBULATORY_CARE_PROVIDER_SITE_OTHER): Payer: Medicare Other | Admitting: Podiatry

## 2020-06-30 DIAGNOSIS — B351 Tinea unguium: Secondary | ICD-10-CM

## 2020-06-30 DIAGNOSIS — M79674 Pain in right toe(s): Secondary | ICD-10-CM | POA: Diagnosis not present

## 2020-06-30 DIAGNOSIS — M79675 Pain in left toe(s): Secondary | ICD-10-CM | POA: Diagnosis not present

## 2020-06-30 NOTE — Progress Notes (Signed)
Complaint:  Visit Type: Patient returns to my office for continued preventative foot care services.  Patient states" my nails have grown long and thick and become painful to walk and wear shoes".    The patient presents for preventative foot care services. She presents to the office with her caregiver.    Podiatric Exam: Vascular: dorsalis pedis and posterior tibial pulses are palpable bilateral. Capillary return is immediate. Temperature gradient is WNL. Skin turgor WNL  Sensorium: Normal Semmes Weinstein monofilament test. Normal tactile sensation bilaterally. Nail Exam: Pt has thick disfigured discolored nails with subungual debris noted bilateral entire nail hallux through fifth toenails Ulcer Exam: There is no evidence of ulcer or pre-ulcerative changes or infection. Orthopedic Exam: Muscle tone and strength are WNL. No limitations in general ROM. No crepitus or effusions noted. Foot type and digits show no abnormalities. Bony prominences are unremarkable. Pes planus. Skin: No Porokeratosis. No infection or ulcers.  Pinch callus right asymptomatic.  Diagnosis:  Onychomycosis, , Pain in right toe, pain in left toes  Treatment & Plan Procedures and Treatment: Consent by patient was obtained for treatment procedures.   Debridement of mycotic and hypertrophic toenails, 1 through 5 bilateral and clearing of subungual debris using nail nipper followed by dremel.. No ulceration, no infection noted.  Return Visit-Office Procedure: Patient instructed to return to the office for a follow up visit 3 months for continued evaluation and treatment.    Gardiner Barefoot DPM

## 2020-08-18 ENCOUNTER — Ambulatory Visit (INDEPENDENT_AMBULATORY_CARE_PROVIDER_SITE_OTHER): Payer: Medicare Other | Admitting: Family Medicine

## 2020-08-18 ENCOUNTER — Encounter: Payer: Self-pay | Admitting: Family Medicine

## 2020-08-18 ENCOUNTER — Other Ambulatory Visit: Payer: Self-pay

## 2020-08-18 VITALS — BP 122/78 | HR 66 | Temp 97.5°F | Ht 60.0 in | Wt 159.0 lb

## 2020-08-18 DIAGNOSIS — F5105 Insomnia due to other mental disorder: Secondary | ICD-10-CM | POA: Diagnosis not present

## 2020-08-18 DIAGNOSIS — R4689 Other symptoms and signs involving appearance and behavior: Secondary | ICD-10-CM | POA: Insufficient documentation

## 2020-08-18 DIAGNOSIS — Z7289 Other problems related to lifestyle: Secondary | ICD-10-CM

## 2020-08-18 DIAGNOSIS — E039 Hypothyroidism, unspecified: Secondary | ICD-10-CM

## 2020-08-18 DIAGNOSIS — F79 Unspecified intellectual disabilities: Secondary | ICD-10-CM | POA: Diagnosis not present

## 2020-08-18 DIAGNOSIS — Z0279 Encounter for issue of other medical certificate: Secondary | ICD-10-CM

## 2020-08-18 DIAGNOSIS — F99 Mental disorder, not otherwise specified: Secondary | ICD-10-CM | POA: Insufficient documentation

## 2020-08-18 MED ORDER — QUETIAPINE FUMARATE ER 50 MG PO TB24: 50.0000 mg | ORAL_TABLET | Freq: Every day | ORAL | 2 refills | Status: DC

## 2020-08-18 NOTE — Progress Notes (Signed)
Established Patient Office Visit  Subjective:  Patient ID: Abigail Webster, female    DOB: [birth year]  Age: 60 y.o. MRN: [id number]  CC:  Chief Complaint  Patient presents with  . Follow-up    6 month follow up, concerns about possibly starting on sleep aid. Referral to neurologist.     HPI Abigail Webster presents for follow-up of her behavior changes.  She has not been sleeping well through the night.  Has exhibited aggressive behavior at daycare with kicking, spitting and hitting.  Patient is a poor historian and has little insight into her behavior when asked about it.  This was reported by her caregiver.  There is a neurology referral pending for evaluation of possible dementia.  Hypothyroidism is well adjusted on her current dose.  Patient is menopausal and has a past medical history of sterilization.  Mammogram has been ordered.  Patient is able to toilet without assistance.  She needs some help with dressing.  There are no issues with constipation or blood in her stool.  Past Medical History:  Diagnosis Date  . Thyroid disease     Past Surgical History:  Procedure Laterality Date  . FOOT SURGERY      Family History  Problem Relation Age of Onset  . Pancreatic cancer Mother   . Hypertension Mother   . Hypothyroidism Mother   . Colon cancer Father   . Hypertension Father     Social History   Socioeconomic History  . Marital status: Single    Spouse name: Not on file  . Number of children: Not on file  . Years of education: Not on file  . Highest education level: Not on file  Occupational History  . Not on file  Tobacco Use  . Smoking status: Never Smoker  . Smokeless tobacco: Never Used  Vaping Use  . Vaping Use: Never used  Substance and Sexual Activity  . Alcohol use: Never  . Drug use: Never  . Sexual activity: Never  Other Topics Concern  . Not on file  Social History Narrative  . Not on file   Social Determinants of Health   Financial Resource  Strain: Low Risk   . Difficulty of Paying Living Expenses: Not hard at all  Food Insecurity: Not on file  Transportation Needs: No Transportation Needs  . Lack of Transportation (Medical): No  . Lack of Transportation (Non-Medical): No  Physical Activity: Inactive  . Days of Exercise per Week: 0 days  . Minutes of Exercise per Session: 0 min  Stress: Not on file  Social Connections: Not on file  Intimate Partner Violence: Not on file    Outpatient Medications Prior to Visit  Medication Sig Dispense Refill  . Cholecalciferol (VITAMIN D3) 50 MCG (2000 UT) capsule TAKE 1 CAPSULE BY MOUTH ONCE A DAY. 30 capsule 11  . Emollient (EUCERIN EX) Apply topically.    . Ergocalciferol (VITAMIN D2 PO)     . levothyroxine (SYNTHROID) 50 MCG tablet Take one each morning on a fasting stomach 1 hour prior to eating breakfast. 100 tablet 3  . MYRBETRIQ 50 MG TB24 tablet Take 50 mg by mouth daily.    Marland Kitchen REFRESH TEARS 0.5 % SOLN Apply to eye.    Marland Kitchen RESTASIS 0.05 % ophthalmic emulsion      No facility-administered medications prior to visit.    No Known Allergies  ROS Review of Systems  Constitutional: Negative.   HENT: Negative.   Eyes: Positive for visual disturbance.  Negative for photophobia.  Respiratory: Negative for cough and shortness of breath.   Cardiovascular: Negative.   Gastrointestinal: Negative for blood in stool and constipation.  Musculoskeletal: Positive for gait problem.  Neurological: Negative for weakness.  Psychiatric/Behavioral: Positive for behavioral problems and sleep disturbance.      Objective:    Physical Exam Vitals and nursing note reviewed.  Constitutional:      General: She is not in acute distress.    Appearance: Normal appearance. She is not ill-appearing, toxic-appearing or diaphoretic.  HENT:     Head: Normocephalic and atraumatic.     Right Ear: Tympanic membrane, ear canal and external ear normal.     Left Ear: Tympanic membrane, ear canal and  external ear normal.  Eyes:     General: No scleral icterus.       Right eye: No discharge.        Left eye: No discharge.     Conjunctiva/sclera: Conjunctivae normal.  Cardiovascular:     Rate and Rhythm: Normal rate and regular rhythm.     Pulses: Normal pulses.  Pulmonary:     Effort: Pulmonary effort is normal.     Breath sounds: Normal breath sounds.  Musculoskeletal:     Cervical back: No rigidity or tenderness.  Lymphadenopathy:     Cervical: No cervical adenopathy.  Skin:      Neurological:     Mental Status: Mental status is at baseline.  Psychiatric:        Behavior: Behavior normal.     BP 122/78   Pulse 66   Temp (!) 97.5 F (36.4 C) (Temporal)   Ht 5' (1.524 m)   Wt 159 lb (72.1 kg)   SpO2 97%   BMI 31.05 kg/m  Wt Readings from Last 3 Encounters:  08/18/20 159 lb (72.1 kg)  06/07/20 156 lb 6.4 oz (70.9 kg)  02/13/20 157 lb 12.8 oz (71.6 kg)     Health Maintenance Due  Topic Date Due  . PAP SMEAR-Modifier  Never done  . MAMMOGRAM  05/24/2019    There are no preventive care reminders to display for this patient.  Lab Results  Component Value Date   TSH 2.67 06/07/2020   Lab Results  Component Value Date   WBC 6.2 05/16/2019   HGB 11.8 05/16/2019   HCT 34.9 (L) 05/16/2019   MCV 90.4 05/16/2019   PLT 258 05/16/2019   Lab Results  Component Value Date   NA 142 05/16/2019   K 4.7 05/16/2019   CO2 25 05/16/2019   GLUCOSE 89 05/16/2019   BUN 19 05/16/2019   CREATININE 1.11 (H) 05/16/2019   BILITOT 0.5 05/16/2019   ALKPHOS 88 05/17/2017   AST 15 05/16/2019   ALT 9 05/16/2019   PROT 6.7 05/16/2019   ALBUMIN 4.3 05/17/2017   CALCIUM 9.3 05/16/2019   GFR 57.90 (L) 05/17/2017   Lab Results  Component Value Date   CHOL 200 05/17/2017   Lab Results  Component Value Date   HDL 74.20 05/17/2017   Lab Results  Component Value Date   LDLCALC 119 (H) 05/17/2017   Lab Results  Component Value Date   TRIG 37.0 05/17/2017   Lab  Results  Component Value Date   CHOLHDL 3 05/17/2017   No results found for: HGBA1C    Assessment & Plan:   Problem List Items Addressed This Visit      Endocrine   Acquired hypothyroidism     Other   Mentally challenged -  Primary   Self-mutilation   Behavioral change   Relevant Medications   QUEtiapine (SEROQUEL XR) 50 MG TB24 24 hr tablet   Insomnia due to other mental disorder   Relevant Medications   QUEtiapine (SEROQUEL XR) 50 MG TB24 24 hr tablet      Meds ordered this encounter  Medications  . QUEtiapine (SEROQUEL XR) 50 MG TB24 24 hr tablet    Sig: Take 1 tablet (50 mg total) by mouth at bedtime.    Dispense:  30 tablet    Refill:  2    Follow-up: Return in about 2 months (around 10/18/2020).  Will appreciate neurology input.  Hopefully will tolerate the XR formulation.  May need to switch to immediate release.  [name], MD

## 2020-08-18 NOTE — Patient Instructions (Signed)
Quetiapine Extended Release Tablets What is this medicine? QUETIAPINE (kwe TYE a peen) is an antipsychotic. It is used to treat schizophrenia and bipolar disorder, also known as manic-depression. It is also used to treat major depression in combination with antidepressants. This medicine may be used for other purposes; ask your health care provider or pharmacist if you have questions. COMMON BRAND NAME(S): Seroquel XR What should I tell my health care provider before I take this medicine? They need to know if you have any of these conditions:  blockage in your bowel  cataracts  constipation  dementia  diabetes  difficulty swallowing  glaucoma  heart disease  high levels of prolactin  history of breast cancer  history of irregular heartbeat  liver disease  low blood counts, like low white cell, platelet, or red cell counts  low blood pressure  Parkinson's disease  prostate disease  seizures  suicidal thoughts, plans or attempt; a previous suicide attempt by you or a family member  thyroid disease  trouble passing urine  an unusual or allergic reaction to quetiapine, other medicines, foods, dyes, or preservatives  pregnant or trying to get pregnant  breast-feeding How should I use this medicine? Take this medicine by mouth with a glass of water. Follow the directions on the prescription label. Do not cut, crush or chew this medicine. Take this medicine on an empty stomach or with a light meal. Do not take this medicine with a full heavy meal. Take your medicine at regular intervals. Do not take it more often than directed. Do not stop taking except on your doctor's advice. A special MedGuide will be given to you by the pharmacist with each prescription and refill. Be sure to read this information carefully each time. Talk to your pediatrician regarding the use of this medicine in children. While this drug may be prescribed for children as young as 10 years for  selected conditions, precautions do apply. Patients over age 64 years may have a stronger reaction to this medicine and need smaller doses. Overdosage: If you think you have taken too much of this medicine contact a poison control center or emergency room at once. NOTE: This medicine is only for you. Do not share this medicine with others. What if I miss a dose? If you miss a dose, take it as soon as you can. If it is almost time for your next dose, take only that dose. Do not take double or extra doses. What may interact with this medicine? Do not take this medicine with any of the following medications:  cisapride  dronedarone  metoclopramide  pimozide  thioridazine This medicine may also interact with the following medications:  alcohol  antihistamines for allergy, cough, and cold  atropine  avasimibe  certain antivirals for HIV or hepatitis  certain medicines for anxiety or sleep  certain medicines for bladder problems like oxybutynin, tolterodine  certain medicines for depression like amitriptyline, fluoxetine, nefazodone, sertraline  certain medicines for fungal infections like fluconazole, ketoconazole, itraconazole, posaconazole  certain medicines for stomach problems like dicyclomine, hyoscyamine  certain medicines for travel sickness like scopolamine  cimetidine  general anesthetics like halothane, isoflurane, methoxyflurane, propofol  ipratropium  levodopa or other medicines for Parkinson's disease  medicines for blood pressure  medicines for seizures  medicines that relax muscles for surgery  narcotic medicines for pain  other medicines that prolong the QT interval (cause an abnormal heart rhythm)  phenothiazines like chlorpromazine, prochlorperazine  rifampin  St. John's wort This list  may not describe all possible interactions. Give your health care provider a list of all the medicines, herbs, non-prescription drugs, or dietary  supplements you use. Also tell them if you smoke, drink alcohol, or use illegal drugs. Some items may interact with your medicine. What should I watch for while using this medicine? Visit your health care professional for regular checks on your progress. Tell your health care professional if symptoms do not start to get better or if they get worse. Do not stop taking except on your health care professional's advice. You may develop a severe reaction. Your health care professional will tell you how much medicine to take. You may need to have an eye exam before and during use of this medicine. Patients and their families should watch out for new or worsening depression or thoughts of suicide. Also watch out for sudden changes in feelings such as feeling anxious, agitated, panicky, irritable, hostile, aggressive, impulsive, severely restless, overly excited and hyperactive, or not being able to sleep. If this happens, especially at the beginning of treatment or after a change in dose, call your healthcare professional. This medicine may increase blood sugar. Ask your health care provider if changes in diet or medicines are needed if you have diabetes. You may get drowsy or dizzy. Do not drive, use machinery, or do anything that needs mental alertness until you know how this medicine affects you. Do not stand or sit up quickly, especially if you are an older patient. This reduces the risk of dizzy or fainting spells. Alcohol may interfere with the effect of this medicine. Avoid alcoholic drinks. This drug can cause problems with controlling your body temperature. It can lower the response of your body to cold temperatures. If possible, stay indoors during cold weather. If you must go outdoors, wear warm clothes. It can also lower the response of your body to heat. Do not overheat. Do not over-exercise. Stay out of the sun when possible. If you must be in the sun, wear cool clothing. Drink plenty of water. If you  have trouble controlling your body temperature, call your health care provider right away. What side effects may I notice from receiving this medicine? Side effects that you should report to your doctor or health care professional as soon as possible:  allergic reactions like skin rash, itching or hives, swelling of the face, lips, or tongue  breathing problems  changes in vision  confusion  elevated mood, decreased need for sleep, racing thoughts, impulsive behavior  eye pain  fast, irregular heartbeat  fever or chills, sore throat  inability to keep still  males: prolonged or painful erection  problems with balance, talking, walking  redness, blistering, peeling, or loosening of the skin, including inside the mouth  seizures  signs and symptoms of high blood sugar such as being more thirsty or hungry or having to urinate more than normal. You may also feel very tired or have blurry vision  signs and symptoms of hypothyroidism like fatigue; increased sensitivity to cold; weight gain; hoarseness; thinning hair  signs and symptoms of low blood pressure like dizziness; feeling faint or lightheaded; falls; unusually weak or tired  signs and symptoms of neuroleptic malignant syndrome like confusion; fast, irregular heartbeat; high fever; increased sweating; stiff muscles  sudden numbness or weakness of the face, arm, or leg  suicidal thoughts or other mood changes  trouble swallowing  uncontrollable movements of the arms, face, head, mouth, neck, or upper body Side effects that usually do  not require medical attention (report to your doctor or health care professional if they continue or are bothersome):  change in sex drive or performance  constipation  drowsiness  dry mouth  upset stomach  weight gain This list may not describe all possible side effects. Call your doctor for medical advice about side effects. You may report side effects to FDA at  1-800-FDA-1088. Where should I keep my medicine? Keep out of the reach of children. Store at room temperature between 15 and 30 degrees C (59 and 86 degrees F). Throw away any unused medicine after the expiration date. NOTE: This sheet is a summary. It may not cover all possible information. If you have questions about this medicine, talk to your doctor, pharmacist, or health care provider.  2021 Elsevier/Gold Standard (2019-03-05 14:10:38)

## 2020-08-27 ENCOUNTER — Telehealth: Payer: Self-pay | Admitting: Family Medicine

## 2020-08-27 NOTE — Telephone Encounter (Signed)
Patients niece Charlena Cross) calling to speak with doctor regarding patients most recent visit and medication (Seroquel XR) that patient was put on. Per Charlena Cross she looked up the medication and seen that this is a antipsychotic medication, she would like to know if this is the best sleep aid to help patient or if there was something else that she could take? If not how long would she have to be on this particular medication, is this the best medication for patients that have similar diagnoses as Ms. Priego also would this be increased at anytime. Please advise.

## 2020-08-27 NOTE — Telephone Encounter (Signed)
Pts niece calling requesting call back about QUEtiapine (SEROQUEL XR) 50 MG TB24 24 hr tablet. Callback [phone]

## 2020-09-17 NOTE — Telephone Encounter (Signed)
Spoke to pt's caregiver, told her calling about medication Seroquil that pt's niece Abigail Webster called about. Pt's caregiver said someone had already spoke to them. Told her okay, thank you.

## 2020-09-28 ENCOUNTER — Other Ambulatory Visit: Payer: Self-pay | Admitting: Urology

## 2020-10-05 ENCOUNTER — Other Ambulatory Visit: Payer: Self-pay | Admitting: Family Medicine

## 2020-10-05 DIAGNOSIS — E039 Hypothyroidism, unspecified: Secondary | ICD-10-CM

## 2020-10-05 DIAGNOSIS — F99 Mental disorder, not otherwise specified: Secondary | ICD-10-CM

## 2020-10-05 DIAGNOSIS — R4689 Other symptoms and signs involving appearance and behavior: Secondary | ICD-10-CM

## 2020-10-05 DIAGNOSIS — E559 Vitamin D deficiency, unspecified: Secondary | ICD-10-CM

## 2020-10-05 DIAGNOSIS — F5105 Insomnia due to other mental disorder: Secondary | ICD-10-CM

## 2020-10-06 ENCOUNTER — Ambulatory Visit: Payer: Medicare Other | Admitting: Podiatry

## 2020-10-12 ENCOUNTER — Encounter (HOSPITAL_BASED_OUTPATIENT_CLINIC_OR_DEPARTMENT_OTHER): Payer: Self-pay | Admitting: Urology

## 2020-10-12 ENCOUNTER — Other Ambulatory Visit: Payer: Self-pay

## 2020-10-12 ENCOUNTER — Encounter: Payer: Self-pay | Admitting: Podiatry

## 2020-10-12 ENCOUNTER — Ambulatory Visit (INDEPENDENT_AMBULATORY_CARE_PROVIDER_SITE_OTHER): Payer: Medicare Other | Admitting: Podiatry

## 2020-10-12 DIAGNOSIS — B351 Tinea unguium: Secondary | ICD-10-CM | POA: Diagnosis not present

## 2020-10-12 DIAGNOSIS — M79675 Pain in left toe(s): Secondary | ICD-10-CM

## 2020-10-12 DIAGNOSIS — L84 Corns and callosities: Secondary | ICD-10-CM

## 2020-10-12 DIAGNOSIS — M79674 Pain in right toe(s): Secondary | ICD-10-CM

## 2020-10-12 NOTE — Progress Notes (Signed)
Spoke with guardian ebony [place] cell  [phone] And patient  has behavorial  issues such as pushing people when rushed or gets anxious. Patient lives in group home [facility] a [facility] for t j and friends, [facility] of group home is Dietitian cell [phone]. Spoke with beverly taavon and patient is not candiate for [place] and needs to be done at [facility] long main or per beverly taavon, left message for pam gibson patient to be moved to [facility] or per beverly taavon rn.

## 2020-10-12 NOTE — Progress Notes (Signed)
Complaint:  Visit Type: Patient returns to my office for continued preventative foot care services.  Patient states" my nails have grown long and thick and become painful to walk and wear shoes".    The patient presents for preventative foot care services. She presents to the office with her caregiver.    Podiatric Exam: Vascular: dorsalis pedis and posterior tibial pulses are palpable bilateral. Capillary return is immediate. Temperature gradient is WNL. Skin turgor WNL  Sensorium: Normal Semmes Weinstein monofilament test. Normal tactile sensation bilaterally. Nail Exam: Pt has thick disfigured discolored nails with subungual debris noted bilateral entire nail hallux through fifth toenails Ulcer Exam: There is no evidence of ulcer or pre-ulcerative changes or infection. Orthopedic Exam: Muscle tone and strength are WNL. No limitations in general ROM. No crepitus or effusions noted. Foot type and digits show no abnormalities. Bony prominences are unremarkable. Pes planus. Skin: No Porokeratosis. No infection or ulcers.  Pinch callus right symptomatic.  Diagnosis:  Onychomycosis, , Pain in right toe, pain in left toes,  Pinch callus right hallux  Treatment & Plan Procedures and Treatment: Consent by patient was obtained for treatment procedures.   Debridement of mycotic and hypertrophic toenails, 1 through 5 bilateral and clearing of subungual debris using nail nipper followed by dremel..Debride callus with # 15 blade.  Patient has callus lateral malleolus left ankle. No ulceration, no infection noted.  Return Visit-Office Procedure: Patient instructed to return to the office for a follow up visit 3 months for continued evaluation and treatment.    Gardiner Barefoot DPM

## 2020-10-13 NOTE — Patient Instructions (Signed)
DUE TO COVID-19 ONLY ONE VISITOR IS ALLOWED TO COME WITH YOU AND STAY IN THE WAITING ROOM ONLY DURING PRE OP AND PROCEDURE DAY OF SURGERY. THE 1 VISITOR  MAY VISIT WITH YOU AFTER SURGERY IN YOUR PRIVATE ROOM DURING VISITING HOURS ONLY!               Casper Harrison   Your procedure is scheduled on: 10/18/20   Report to [facility] Main  Entrance   Report to admitting at : 12:00 PM     Call this number if you have problems the morning of surgery [phone]    Remember: Do not eat solid food :After Midnight. Clear liquids until: 11:00 am.  CLEAR LIQUID DIET  Foods Allowed                                                                     Foods Excluded  Coffee and tea, regular and decaf                             liquids that you cannot  Plain Jell-O any favor except red or purple                                           see through such as: Fruit ices (not with fruit pulp)                                     milk, soups, orange juice  Iced Popsicles                                    All solid food Carbonated beverages, regular and diet                                    Cranberry, grape and apple juices Sports drinks like Gatorade Lightly seasoned clear broth or consume(fat free) Sugar, honey syrup  Sample Menu Breakfast                                Lunch                                     Supper Cranberry juice                    Beef broth                            Chicken broth Jell-O  Grape juice                           Apple juice Coffee or tea                        Jell-O                                      Popsicle                                                Coffee or tea                        Coffee or tea  _____________________________________________________________________   BRUSH YOUR TEETH MORNING OF SURGERY AND RINSE YOUR MOUTH OUT, NO CHEWING GUM CANDY OR MINTS.    Take these medicines the morning of  surgery with A SIP OF WATER: synthroid.                               You may not have any metal on your body including hair pins and              piercings  Do not wear jewelry, make-up, lotions, powders or perfumes, deodorant             Do not wear nail polish on your fingernails.  Do not shave  48 hours prior to surgery.    Do not bring valuables to the hospital. [place].  Contacts, dentures or bridgework may not be worn into surgery.  Leave suitcase in the car. After surgery it may be brought to your room.     Patients discharged the day of surgery will not be allowed to drive home. IF YOU ARE HAVING SURGERY AND GOING HOME THE SAME DAY, YOU MUST HAVE AN ADULT TO DRIVE YOU HOME AND BE WITH YOU FOR 24 HOURS. YOU MAY GO HOME BY TAXI OR UBER OR ORTHERWISE, BUT AN ADULT MUST ACCOMPANY YOU HOME AND STAY WITH YOU FOR 24 HOURS.  Name and phone number of your driver:  Special Instructions: N/A              Please read over the following fact sheets you were given: _____________________________________________________________________           [facility] - Preparing for Surgery Before surgery, you can play an important role.  Because skin is not sterile, your skin needs to be as free of germs as possible.  You can reduce the number of germs on your skin by washing with CHG (chlorahexidine gluconate) soap before surgery.  CHG is an antiseptic cleaner which kills germs and bonds with the skin to continue killing germs even after washing. Please DO NOT use if you have an allergy to CHG or antibacterial soaps.  If your skin becomes reddened/irritated stop using the CHG and inform your nurse when you arrive at Short Stay. Do not shave (including legs and underarms) for at least 48 hours prior to the first  CHG shower.  You may shave your face/neck. Please follow these instructions carefully:  1.  Shower with CHG Soap the night before surgery and the   morning of Surgery.  2.  If you choose to wash your hair, wash your hair first as usual with your  normal  shampoo.  3.  After you shampoo, rinse your hair and body thoroughly to remove the  shampoo.                           4.  Use CHG as you would any other liquid soap.  You can apply chg directly  to the skin and wash                       Gently with a scrungie or clean washcloth.  5.  Apply the CHG Soap to your body ONLY FROM THE NECK DOWN.   Do not use on face/ open                           Wound or open sores. Avoid contact with eyes, ears mouth and genitals (private parts).                       Wash face,  Genitals (private parts) with your normal soap.             6.  Wash thoroughly, paying special attention to the area where your surgery  will be performed.  7.  Thoroughly rinse your body with warm water from the neck down.  8.  DO NOT shower/wash with your normal soap after using and rinsing off  the CHG Soap.                9.  Pat yourself dry with a clean towel.            10.  Wear clean pajamas.            11.  Place clean sheets on your bed the night of your first shower and do not  sleep with pets. Day of Surgery : Do not apply any lotions/deodorants the morning of surgery.  Please wear clean clothes to the hospital/surgery center.  FAILURE TO FOLLOW THESE INSTRUCTIONS MAY RESULT IN THE CANCELLATION OF YOUR SURGERY PATIENT SIGNATURE_________________________________  NURSE SIGNATURE__________________________________  ________________________________________________________________________

## 2020-10-14 ENCOUNTER — Other Ambulatory Visit: Payer: Self-pay

## 2020-10-14 ENCOUNTER — Encounter (HOSPITAL_COMMUNITY): Payer: Self-pay

## 2020-10-14 ENCOUNTER — Encounter (HOSPITAL_COMMUNITY)
Admission: RE | Admit: 2020-10-14 | Discharge: 2020-10-14 | Disposition: A | Discharge status: Home / Self Care | Payer: Medicare Other | Source: Ambulatory Visit | Attending: Urology | Admitting: Urology

## 2020-10-14 DIAGNOSIS — Z01812 Encounter for preprocedural laboratory examination: Secondary | ICD-10-CM | POA: Diagnosis not present

## 2020-10-14 HISTORY — DX: Hypothyroidism, unspecified: E03.9

## 2020-10-14 LAB — CBC
HCT: 41.7 % (ref 36.0–46.0)
Hemoglobin: 13.2 g/dL (ref 12.0–15.0)
MCH: 30.1 pg (ref 26.0–34.0)
MCHC: 31.7 g/dL (ref 30.0–36.0)
MCV: 95.2 fL (ref 80.0–100.0)
Platelets: 278 10*3/uL (ref 150–400)
RBC: 4.38 MIL/uL (ref 3.87–5.11)
RDW: 13.5 % (ref 11.5–15.5)
WBC: 4.8 10*3/uL (ref 4.0–10.5)
nRBC: 0 % (ref 0.0–0.2)

## 2020-10-14 NOTE — Progress Notes (Signed)
COVID Vaccine Completed: Yes Date COVID Vaccine completed: 01/30/20 COVID vaccine manufacturer: [employer]      PCP - Dr. Jon Billings Cardiologist -   Chest x-ray -  EKG -  Stress Test -  ECHO -  Cardiac Cath -  Pacemaker/ICD device last checked:  Sleep Study -  CPAP -   Fasting Blood Sugar -  Checks Blood Sugar _____ times a day  Blood Thinner Instructions: Aspirin Instructions: Last Dose:  Anesthesia review:   Patient denies shortness of breath, fever, cough and chest pain at PAT appointment   Patient verbalized understanding of instructions that were given to them at the PAT appointment. Patient was also instructed that they will need to review over the PAT instructions again at home before surgery.

## 2020-10-18 ENCOUNTER — Encounter (HOSPITAL_COMMUNITY)
Admission: RE | Disposition: A | Discharge status: Home / Self Care | Payer: Self-pay | Source: Home / Self Care | Attending: Urology

## 2020-10-18 ENCOUNTER — Ambulatory Visit (HOSPITAL_COMMUNITY): Payer: Medicare Other | Admitting: Physician Assistant

## 2020-10-18 ENCOUNTER — Encounter (HOSPITAL_COMMUNITY): Payer: Self-pay | Admitting: Urology

## 2020-10-18 ENCOUNTER — Ambulatory Visit (HOSPITAL_COMMUNITY)
Admission: RE | Admit: 2020-10-18 | Discharge: 2020-10-18 | Disposition: A | Discharge status: Home / Self Care | Payer: Medicare Other | Attending: Urology | Admitting: Urology

## 2020-10-18 ENCOUNTER — Ambulatory Visit: Payer: Medicare Other | Admitting: Family Medicine

## 2020-10-18 DIAGNOSIS — Z79890 Hormone replacement therapy (postmenopausal): Secondary | ICD-10-CM | POA: Diagnosis not present

## 2020-10-18 DIAGNOSIS — Z79899 Other long term (current) drug therapy: Secondary | ICD-10-CM | POA: Insufficient documentation

## 2020-10-18 DIAGNOSIS — C672 Malignant neoplasm of lateral wall of bladder: Secondary | ICD-10-CM | POA: Diagnosis not present

## 2020-10-18 DIAGNOSIS — D494 Neoplasm of unspecified behavior of bladder: Secondary | ICD-10-CM | POA: Diagnosis present

## 2020-10-18 HISTORY — PX: TRANSURETHRAL RESECTION OF BLADDER TUMOR WITH MITOMYCIN-C: SHX6459

## 2020-10-18 SURGERY — TRANSURETHRAL RESECTION OF BLADDER TUMOR WITH MITOMYCIN-C
Anesthesia: General

## 2020-10-18 MED ORDER — PROPOFOL 10 MG/ML IV BOLUS
INTRAVENOUS | Status: AC
  Filled 2020-10-18: qty 20

## 2020-10-18 MED ORDER — DEXAMETHASONE SODIUM PHOSPHATE 10 MG/ML IJ SOLN
INTRAMUSCULAR | Status: DC | PRN
  Administered 2020-10-18: 4 mg via INTRAVENOUS

## 2020-10-18 MED ORDER — FENTANYL CITRATE (PF) 100 MCG/2ML IJ SOLN: 25.0000 ug | INTRAMUSCULAR | Status: DC | PRN

## 2020-10-18 MED ORDER — FENTANYL CITRATE (PF) 100 MCG/2ML IJ SOLN
INTRAMUSCULAR | Status: AC
  Filled 2020-10-18: qty 2

## 2020-10-18 MED ORDER — ORAL CARE MOUTH RINSE: 15.0000 mL | Freq: Once | OROMUCOSAL | Status: AC

## 2020-10-18 MED ORDER — DEXAMETHASONE SODIUM PHOSPHATE 10 MG/ML IJ SOLN
INTRAMUSCULAR | Status: AC
  Filled 2020-10-18: qty 1

## 2020-10-18 MED ORDER — CHLORHEXIDINE GLUCONATE 0.12 % MT SOLN
15.0000 mL | Freq: Once | OROMUCOSAL | Status: AC
  Administered 2020-10-18: 15 mL via OROMUCOSAL

## 2020-10-18 MED ORDER — ROCURONIUM BROMIDE 10 MG/ML (PF) SYRINGE
PREFILLED_SYRINGE | INTRAVENOUS | Status: AC
  Filled 2020-10-18: qty 10

## 2020-10-18 MED ORDER — LIDOCAINE 2% (20 MG/ML) 5 ML SYRINGE
INTRAMUSCULAR | Status: AC
  Filled 2020-10-18: qty 10

## 2020-10-18 MED ORDER — SODIUM CHLORIDE 0.9 % IR SOLN
Status: DC | PRN
  Administered 2020-10-18: 3000 mL via INTRAVESICAL

## 2020-10-18 MED ORDER — GEMCITABINE CHEMO FOR BLADDER INSTILLATION 2000 MG
2000.0000 mg | Freq: Once | INTRAVENOUS | Status: AC
Start: 2020-10-18 — End: 2020-10-18
  Administered 2020-10-18: 2000 mg via INTRAVESICAL
  Filled 2020-10-18: qty 2000

## 2020-10-18 MED ORDER — LIDOCAINE 2% (20 MG/ML) 5 ML SYRINGE
INTRAMUSCULAR | Status: AC
  Filled 2020-10-18: qty 5

## 2020-10-18 MED ORDER — CEFAZOLIN SODIUM-DEXTROSE 2-4 GM/100ML-% IV SOLN
INTRAVENOUS | Status: AC
  Filled 2020-10-18: qty 100

## 2020-10-18 MED ORDER — MIDAZOLAM HCL 2 MG/2ML IJ SOLN
INTRAMUSCULAR | Status: AC
  Filled 2020-10-18: qty 2

## 2020-10-18 MED ORDER — ROCURONIUM BROMIDE 10 MG/ML (PF) SYRINGE
PREFILLED_SYRINGE | INTRAVENOUS | Status: DC | PRN
  Administered 2020-10-18: 30 mg via INTRAVENOUS

## 2020-10-18 MED ORDER — SUGAMMADEX SODIUM 200 MG/2ML IV SOLN
INTRAVENOUS | Status: DC | PRN
  Administered 2020-10-18: 200 mg via INTRAVENOUS

## 2020-10-18 MED ORDER — ACETAMINOPHEN 10 MG/ML IV SOLN: 1000.0000 mg | Freq: Once | INTRAVENOUS | Status: DC | PRN

## 2020-10-18 MED ORDER — HYDROCODONE-ACETAMINOPHEN 5-325 MG PO TABS: 1.0000 | ORAL_TABLET | ORAL | 0 refills | Status: DC | PRN

## 2020-10-18 MED ORDER — AMISULPRIDE (ANTIEMETIC) 5 MG/2ML IV SOLN: 10.0000 mg | Freq: Once | INTRAVENOUS | Status: DC | PRN

## 2020-10-18 MED ORDER — KETOROLAC TROMETHAMINE 30 MG/ML IJ SOLN: 30.0000 mg | Freq: Once | INTRAMUSCULAR | Status: DC

## 2020-10-18 MED ORDER — FENTANYL CITRATE (PF) 100 MCG/2ML IJ SOLN
INTRAMUSCULAR | Status: DC | PRN
  Administered 2020-10-18: 100 ug via INTRAVENOUS

## 2020-10-18 MED ORDER — OXYCODONE HCL 5 MG/5ML PO SOLN: 5.0000 mg | Freq: Once | ORAL | Status: DC | PRN

## 2020-10-18 MED ORDER — CEFAZOLIN SODIUM-DEXTROSE 2-4 GM/100ML-% IV SOLN
2.0000 g | INTRAVENOUS | Status: AC
  Administered 2020-10-18: 2 g via INTRAVENOUS

## 2020-10-18 MED ORDER — ONDANSETRON HCL 4 MG/2ML IJ SOLN
INTRAMUSCULAR | Status: DC | PRN
  Administered 2020-10-18: 4 mg via INTRAVENOUS

## 2020-10-18 MED ORDER — LACTATED RINGERS IV SOLN: INTRAVENOUS | Status: DC

## 2020-10-18 MED ORDER — MIDAZOLAM HCL 5 MG/5ML IJ SOLN
INTRAMUSCULAR | Status: DC | PRN
  Administered 2020-10-18: 2 mg via INTRAVENOUS

## 2020-10-18 MED ORDER — LIDOCAINE 2% (20 MG/ML) 5 ML SYRINGE
INTRAMUSCULAR | Status: DC | PRN
  Administered 2020-10-18: 60 mg via INTRAVENOUS

## 2020-10-18 MED ORDER — PROPOFOL 10 MG/ML IV BOLUS
INTRAVENOUS | Status: DC | PRN
  Administered 2020-10-18: 20 mg via INTRAVENOUS
  Administered 2020-10-18: 100 mg via INTRAVENOUS
  Administered 2020-10-18: 20 mg via INTRAVENOUS

## 2020-10-18 MED ORDER — OXYCODONE HCL 5 MG PO TABS: 5.0000 mg | ORAL_TABLET | Freq: Once | ORAL | Status: DC | PRN

## 2020-10-18 MED ORDER — ONDANSETRON HCL 4 MG/2ML IJ SOLN
INTRAMUSCULAR | Status: AC
  Filled 2020-10-18: qty 2

## 2020-10-18 MED ORDER — PROMETHAZINE HCL 25 MG/ML IJ SOLN: 6.2500 mg | INTRAMUSCULAR | Status: DC | PRN

## 2020-10-18 SURGICAL SUPPLY — 18 items
BAG DRN RND TRDRP ANRFLXCHMBR (UROLOGICAL SUPPLIES)
BAG URINE DRAIN 2000ML AR STRL (UROLOGICAL SUPPLIES) IMPLANT
BAG URO CATCHER STRL LF (MISCELLANEOUS) ×2 IMPLANT
CATH FOLEY 2WAY SLVR  5CC 18FR (CATHETERS) ×2
CATH FOLEY 2WAY SLVR 5CC 18FR (CATHETERS) IMPLANT
DRAPE FOOT SWITCH (DRAPES) ×2 IMPLANT
ELECT REM PT RETURN 15FT ADLT (MISCELLANEOUS) ×2 IMPLANT
GLOVE SURG ENC MOIS LTX SZ7.5 (GLOVE) ×2 IMPLANT
GOWN STRL REUS W/TWL XL LVL3 (GOWN DISPOSABLE) ×2 IMPLANT
KIT TURNOVER KIT A (KITS) ×2 IMPLANT
LOOP CUT BIPOLAR 24F LRG (ELECTROSURGICAL) IMPLANT
MANIFOLD NEPTUNE II (INSTRUMENTS) ×2 IMPLANT
PACK CYSTO (CUSTOM PROCEDURE TRAY) ×2 IMPLANT
PLUG CATH AND CAP STER (CATHETERS) IMPLANT
SYR TOOMEY IRRIG 70ML (MISCELLANEOUS)
SYRINGE TOOMEY IRRIG 70ML (MISCELLANEOUS) IMPLANT
TUBING CONNECTING 10 (TUBING) ×2 IMPLANT
TUBING UROLOGY SET (TUBING) ×2 IMPLANT

## 2020-10-18 NOTE — Op Note (Signed)
Operative Note  Preoperative diagnosis:  1.  Bladder tumor  Postoperative diagnosis: 1.  Bladder tumor-status medium  Procedure(s): 1.  Transurethral resection of bladder tumor-status medium 2.  Intravesical instillation of gemcitabine  Surgeon: Link Snuffer, MD  Assistants: None  Anesthesia: General  Complications: None immediate  EBL: Minimal  Specimens: 1.  Bladder tumor  Drains/Catheters: 1.  18 French Foley catheter  Intraoperative findings: 1.  Normal urethra 2.  Approximately 2 cm bladder tumor superficial in nature and papillary on the left lateral wall just lateral to the left ureteral orifice.  Entire tumor completely resected superficially.  No other bladder tumors.  Resection was well away from the ureteral orifice  Indication: 60 year old female found to have a bladder tumor presents for the previously mentioned operation.  Description of procedure:  The patient was identified and consent was obtained.  The patient was taken to the operating room and placed in the supine position.  The patient was placed under general anesthesia.  Perioperative antibiotics were administered.  The patient was placed in dorsal lithotomy.  Patient was prepped and draped in a standard sterile fashion and a timeout was performed.  A 26 French resectoscope with a visual obturator in place was advanced into the urethra and into the bladder.  This was exchanged for the bipolar working element.  The entire bladder was inspected.  The bladder tumor of interest was resected on bipolar settings and the resection bed and surrounding area was fulgurated.  Total resection area was about 2.5 cm.  I collected the bladder tumor specimen.  I reinspected the bladder mucosa and there was no active bleeding.  I withdrew the scope and placed an 28 French Foley catheter.  In the PACU, intravesical instillation of gemcitabine was performed.  This remained in the bladder for approximately 1 hour.  Plan:  Follow-up in 1 week for pathology review

## 2020-10-18 NOTE — Anesthesia Preprocedure Evaluation (Addendum)
Anesthesia Evaluation  Patient identified by MRN, date of birth, ID band Patient awake    Reviewed: Allergy & Precautions, NPO status , Patient's Chart, lab work & pertinent test results  Airway Mallampati: IV  TM Distance: >3 FB Neck ROM: Full    Dental  (+) Poor Dentition, Chipped   Pulmonary neg pulmonary ROS,    Pulmonary exam normal breath sounds clear to auscultation       Cardiovascular negative cardio ROS Normal cardiovascular exam Rhythm:Regular Rate:Normal     Neuro/Psych PSYCHIATRIC DISORDERS Mentally challenged Blind right eye    GI/Hepatic negative GI ROS, Neg liver ROS,   Endo/Other  Hypothyroidism   Renal/GU negative Renal ROS     Musculoskeletal negative musculoskeletal ROS (+)   Abdominal (+) + obese,   Peds  Hematology negative hematology ROS (+)   Anesthesia Other Findings bladder tumor  Reproductive/Obstetrics                            Anesthesia Physical Anesthesia Plan  ASA: 3  Anesthesia Plan: General   Post-op Pain Management:    Induction: Intravenous  PONV Risk Score and Plan: 4 or greater and Ondansetron, Dexamethasone, Midazolam and Treatment may vary due to age or medical condition  Airway Management Planned: Oral ETT and Video Laryngoscope Planned  Additional Equipment:   Intra-op Plan:   Post-operative Plan: Extubation in OR  Informed Consent: I have reviewed the patients History and Physical, chart, labs and discussed the procedure including the risks, benefits and alternatives for the proposed anesthesia with the patient or authorized representative who has indicated his/her understanding and acceptance.     Dental advisory given and Consent reviewed with POA  Plan Discussed with: CRNA  Anesthesia Plan Comments:        Anesthesia Quick Evaluation

## 2020-10-18 NOTE — Anesthesia Procedure Notes (Signed)
Procedure Name: Intubation Date/Time: 10/18/2020 2:50 PM Performed by: Milford Cage, CRNA Pre-anesthesia Checklist: Patient identified, Emergency Drugs available, Suction available and Patient being monitored Patient Re-evaluated:Patient Re-evaluated prior to induction Oxygen Delivery Method: Circle System Utilized Preoxygenation: Pre-oxygenation with 100% oxygen Induction Type: IV induction Ventilation: Mask ventilation without difficulty Laryngoscope Size: Glidescope and 3 Grade View: Grade I Tube type: Oral Tube size: 7.0 mm Number of attempts: 1 Airway Equipment and Method: Rigid stylet and Video-laryngoscopy Placement Confirmation: ETT inserted through vocal cords under direct vision, positive ETCO2 and breath sounds checked- equal and bilateral Secured at: 20 cm Tube secured with: Tape Dental Injury: Teeth and Oropharynx as per pre-operative assessment  Difficulty Due To: Difficulty was anticipated, Difficult Airway- due to large tongue and Difficult Airway- due to limited oral opening

## 2020-10-18 NOTE — Anesthesia Postprocedure Evaluation (Signed)
Anesthesia Post Note  Patient: Abigail Webster  Procedure(s) Performed: TRANSURETHRAL RESECTION OF BLADDER TUMOR WITH GEMCITABINE     Patient location during evaluation: PACU Anesthesia Type: General Level of consciousness: awake Pain management: pain level controlled Vital Signs Assessment: post-procedure vital signs reviewed and stable Respiratory status: spontaneous breathing, nonlabored ventilation, respiratory function stable and patient connected to nasal cannula oxygen Cardiovascular status: blood pressure returned to baseline and stable Postop Assessment: no apparent nausea or vomiting Anesthetic complications: no   No notable events documented.  Last Vitals:  Vitals:   10/18/20 1700 10/18/20 1730  BP: 134/79 134/79  Pulse: 63   Resp: 15 20  Temp: (!) 35.9 C (!) 35.9 C  SpO2: 95% 95%    Last Pain:  Vitals:   10/18/20 1730  TempSrc:   PainSc: 0-No pain                 Cadynce Garrette P Dyrell Tuccillo

## 2020-10-18 NOTE — Transfer of Care (Signed)
Immediate Anesthesia Transfer of Care Note  Patient: Abigail Webster  Procedure(s) Performed: TRANSURETHRAL RESECTION OF BLADDER TUMOR WITH GEMCITABINE  Patient Location: PACU  Anesthesia Type:General  Level of Consciousness: drowsy  Airway & Oxygen Therapy: Patient Spontanous Breathing and Patient connected to face mask oxygen  Post-op Assessment: Report given to RN and Post -op Vital signs reviewed and stable  Post vital signs: Reviewed and stable  Last Vitals:  Vitals Value Taken Time  BP 152/97 10/18/20 1536  Temp    Pulse 80 10/18/20 1542  Resp 16 10/18/20 1542  SpO2 100 % 10/18/20 1542  Vitals shown include unvalidated device data.  Last Pain:  Vitals:   10/18/20 1353  TempSrc:   PainSc: 0-No pain         Complications: No notable events documented.

## 2020-10-18 NOTE — H&P (Addendum)
CC/HPI: Patient was being assessed for incontinence and underwent a cystoscopy. She was incidentally found to have the following:   patient had superficial 1 cm papillary tumor along left lateral wall cephalad to the left ureter. She had mild bladder trabeculation. No evidence of carcinoma situ. No cystitis. Trigone normal.   Given this finding, she underwent a CT IVP. This revealed some mildly prominent pelvic lymph nodes side the bladder for which attention the follow-up was recommended. There was some bladder wall thickening correlating to the area seen on cystoscopy.     ALLERGIES: None   MEDICATIONS: Myrbetriq 50 mg tablet, extended release 24 hr 1 tablet PO Daily  Eucerin cream  Levothyroxine 50 mcg capsule  Refresh Tears  Restasis  Vitamin D3     GU PSH: Cystoscopy - 08/11/2020 Locm 300-399Mg /Ml Iodine,1Ml - 09/07/2020     NON-GU PSH: None   GU PMH: Incontinence w/o Sensation - 09/07/2020, - 08/11/2020, - 07/08/2020 Nocturnal Enuresis - 08/11/2020, - 07/08/2020 Urge incontinence - 07/08/2020 Urinary Frequency - 07/08/2020    NON-GU PMH: None   FAMILY HISTORY: None   SOCIAL HISTORY: Marital Status: Single Current Smoking Status: Patient has never smoked.   Tobacco Use Assessment Completed: Used Tobacco in last 30 days? Drinks 1 caffeinated drink per day. Patient's occupation is/was disabled.    REVIEW OF SYSTEMS:    GU Review Female:   Patient denies frequent urination, hard to postpone urination, burning /pain with urination, get up at night to urinate, leakage of urine, stream starts and stops, trouble starting your stream, have to strain to urinate, and being pregnant.  Gastrointestinal (Upper):   Patient denies nausea, vomiting, and indigestion/ heartburn.  Gastrointestinal (Lower):   Patient denies diarrhea and constipation.  Constitutional:   Patient denies fever, night sweats, weight loss, and fatigue.  Skin:   Patient denies skin rash/ lesion and itching.  Eyes:    Patient denies blurred vision and double vision.  Ears/ Nose/ Throat:   Patient denies sore throat and sinus problems.  Hematologic/Lymphatic:   Patient denies easy bruising and swollen glands.  Cardiovascular:   Patient denies leg swelling and chest pains.  Respiratory:   Patient denies cough and shortness of breath.  Endocrine:   Patient denies excessive thirst.  Musculoskeletal:   Patient denies back pain and joint pain.  Neurological:   Patient denies headaches and dizziness.  Psychologic:   Patient denies depression and anxiety.   VITAL SIGNS: None   PE: NAD Abd S/NT/ND  Complexity of Data:  Source Of History:  Patient  Records Review:   Previous Patient Records  Urine Test Review:   Urinalysis  X-Ray Review: C.T. Abdomen/Pelvis: Reviewed Films. Reviewed Report. Discussed With Patient.     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 Risks and benefits discussed including but not limited to bleeding, infection, injury to surrounding structures, bladder perforation requiring exploratory laparotomy, need for additional procedures, abnormal reaction to chemotherapy.   CC: Dr. Ethelene Hal   Signed by Link Snuffer, III, M.D. on 09/24/20 at 10:42 AM (EDT

## 2020-10-18 NOTE — Discharge Instructions (Signed)

## 2020-10-19 ENCOUNTER — Encounter (HOSPITAL_COMMUNITY): Payer: Self-pay | Admitting: Urology

## 2020-10-19 LAB — SURGICAL PATHOLOGY

## 2020-11-03 ENCOUNTER — Ambulatory Visit (INDEPENDENT_AMBULATORY_CARE_PROVIDER_SITE_OTHER): Payer: Medicare Other

## 2020-11-03 ENCOUNTER — Encounter: Payer: Self-pay | Admitting: Family Medicine

## 2020-11-03 ENCOUNTER — Other Ambulatory Visit: Payer: Self-pay

## 2020-11-03 ENCOUNTER — Ambulatory Visit (INDEPENDENT_AMBULATORY_CARE_PROVIDER_SITE_OTHER): Payer: Medicare Other | Admitting: Family Medicine

## 2020-11-03 VITALS — BP 115/70 | HR 58 | Temp 97.4°F | Ht 60.0 in | Wt 159.2 lb

## 2020-11-03 DIAGNOSIS — E039 Hypothyroidism, unspecified: Secondary | ICD-10-CM

## 2020-11-03 DIAGNOSIS — L84 Corns and callosities: Secondary | ICD-10-CM

## 2020-11-03 DIAGNOSIS — F5105 Insomnia due to other mental disorder: Secondary | ICD-10-CM | POA: Diagnosis not present

## 2020-11-03 DIAGNOSIS — R4689 Other symptoms and signs involving appearance and behavior: Secondary | ICD-10-CM

## 2020-11-03 DIAGNOSIS — F99 Mental disorder, not otherwise specified: Secondary | ICD-10-CM

## 2020-11-03 NOTE — Progress Notes (Addendum)
Established Patient Office Visit  Subjective:  Patient ID: Abigail Webster, female    DOB: 1960-12-16  Age: 60 y.o. MRN: [id number]  CC:  Chief Complaint  Patient presents with   Follow-up    2 month follow up, no concerns.     HPI GIARA MCGAUGHEY presents for evaluation of a callus on the left lateral malleolus.  Patient goes to podiatrist every 3 months for toenail debridement.  Callus was noted on the left lateral malleolus and she was advised to see primary care.  Patient does have a history of open reduction internal fixation of this ankle.  Patient's niece is concerned about the use of Seroquel.  Caregiver says that she would like to be present for further medical treatment of patient's insomnia and behavior issues.  Insomnia and behavior issues have not improved much since starting the Seroquel.  Past Medical History:  Diagnosis Date   Hypothyroidism    Thyroid disease     Past Surgical History:  Procedure Laterality Date   FOOT SURGERY     TRANSURETHRAL RESECTION OF BLADDER TUMOR WITH MITOMYCIN-C N/A 10/18/2020   Procedure: TRANSURETHRAL RESECTION OF BLADDER TUMOR WITH GEMCITABINE;  Surgeon: Lucas Mallow, MD;  Location: [facility];  Service: Urology;  Laterality: N/A;    Family History  Problem Relation Age of Onset   Pancreatic cancer Mother    Hypertension Mother    Hypothyroidism Mother    Colon cancer Father    Hypertension Father     Social History   Socioeconomic History   Marital status: Single    Spouse name: Not on file   Number of children: Not on file   Years of education: Not on file   Highest education level: Not on file  Occupational History   Not on file  Tobacco Use   Smoking status: Never   Smokeless tobacco: Never  Vaping Use   Vaping Use: Never used  Substance and Sexual Activity   Alcohol use: Never   Drug use: Never   Sexual activity: Never  Other Topics Concern   Not on file  Social History Narrative   Not on file   Social  Determinants of Health   Financial Resource Strain: Low Risk    Difficulty of Paying Living Expenses: Not hard at all  Food Insecurity: Not on file  Transportation Needs: No Transportation Needs   Lack of Transportation (Medical): No   Lack of Transportation (Non-Medical): No  Physical Activity: Inactive   Days of Exercise per Week: 0 days   Minutes of Exercise per Session: 0 min  Stress: Not on file  Social Connections: Not on file  Intimate Partner Violence: Not on file    Outpatient Medications Prior to Visit  Medication Sig Dispense Refill   D3 SUPER STRENGTH 50 MCG (2000 UT) CAPS TAKE 1 CAPSULE BY MOUTH ONCE DAILY 30 capsule 3   Emollient (EUCERIN EX) Apply 1 application topically daily.     Ergocalciferol (VITAMIN D2 PO)      levothyroxine (SYNTHROID) 50 MCG tablet TAKE 1 TABLET BY MOUTH ONCE DAILY IN THE MORNING ON A FASTING STOMACH 1 HOUR BEFORE EATING 90 tablet 1   MYRBETRIQ 50 MG TB24 tablet Take 50 mg by mouth daily.     QUEtiapine (SEROQUEL XR) 50 MG TB24 24 hr tablet TAKE 1 TABLET BY MOUTH AT BEDTIME 30 tablet 2   REFRESH TEARS 0.5 % SOLN Place 1 drop into both eyes daily.  RESTASIS 0.05 % ophthalmic emulsion Place 1 drop into both eyes 2 (two) times daily.     HYDROcodone-acetaminophen (NORCO) 5-325 MG tablet Take 1 tablet by mouth every 4 (four) hours as needed for moderate pain. (Patient not taking: Reported on 11/03/2020) 6 tablet 0   No facility-administered medications prior to visit.    No Known Allergies  ROS Review of Systems  Constitutional: Negative.   HENT: Negative.    Respiratory: Negative.    Cardiovascular: Negative.   Gastrointestinal: Negative.   Skin:  Negative for wound.  Neurological:  Negative for weakness.  Psychiatric/Behavioral:  Positive for behavioral problems and sleep disturbance.      Objective:    Physical Exam Vitals and nursing note reviewed.  Constitutional:      General: She is not in acute distress.    Appearance:  Normal appearance. She is not ill-appearing, toxic-appearing or diaphoretic.  HENT:     Head: Normocephalic and atraumatic.     Right Ear: External ear normal.     Left Ear: External ear normal.  Pulmonary:     Effort: Pulmonary effort is normal.  Skin:      Neurological:     Mental Status: She is alert.  Psychiatric:        Behavior: Behavior normal.    BP 115/70   Pulse (!) 58   Temp (!) 97.4 F (36.3 C) (Temporal)   Ht 5' (1.524 m)   Wt 159 lb 3.2 oz (72.2 kg)   SpO2 100%   BMI 31.09 kg/m  Wt Readings from Last 3 Encounters:  11/03/20 159 lb 3.2 oz (72.2 kg)  10/18/20 159 lb (72.1 kg)  08/18/20 159 lb (72.1 kg)     Health Maintenance Due  Topic Date Due   PAP SMEAR-Modifier  Never done   Zoster Vaccines- Shingrix (1 of 2) Never done   MAMMOGRAM  05/24/2019   COVID-19 Vaccine (4 - Booster for Pfizer series) 06/29/2020    There are no preventive care reminders to display for this patient.  Lab Results  Component Value Date   TSH 2.67 06/07/2020   Lab Results  Component Value Date   WBC 4.8 10/14/2020   HGB 13.2 10/14/2020   HCT 41.7 10/14/2020   MCV 95.2 10/14/2020   PLT 278 10/14/2020   Lab Results  Component Value Date   NA 142 05/16/2019   K 4.7 05/16/2019   CO2 25 05/16/2019   GLUCOSE 89 05/16/2019   BUN 19 05/16/2019   CREATININE 1.11 (H) 05/16/2019   BILITOT 0.5 05/16/2019   ALKPHOS 88 05/17/2017   AST 15 05/16/2019   ALT 9 05/16/2019   PROT 6.7 05/16/2019   ALBUMIN 4.3 05/17/2017   CALCIUM 9.3 05/16/2019   GFR 57.90 (L) 05/17/2017   Lab Results  Component Value Date   CHOL 200 05/17/2017   Lab Results  Component Value Date   HDL 74.20 05/17/2017   Lab Results  Component Value Date   LDLCALC 119 (H) 05/17/2017   Lab Results  Component Value Date   TRIG 37.0 05/17/2017   Lab Results  Component Value Date   CHOLHDL 3 05/17/2017   No results found for: HGBA1C    Assessment & Plan:   Problem List Items Addressed This  Visit       Endocrine   Acquired hypothyroidism     Musculoskeletal and Integument   Callus   Relevant Orders   DG Ankle Complete Left (Completed)   Ambulatory referral to  Orthopedic Surgery     Other   Behavioral change - Primary   Insomnia due to other mental disorder    No orders of the defined types were placed in this encounter.   Follow-up: Return in about 3 months (around 02/03/2021).   We will follow-up in 3 months to follow hypothyroidism and behavioral changes.  Caregiver will arrange for patient's niece to be present or the encounter.  Consider orthopedic referral if prior pending placement of open reduction internal fixation is involved with callus. [name], MD

## 2020-11-03 NOTE — Addendum Note (Signed)
Addended by: Lynnea Ferrier on: 11/03/2020 11:18 AM   Modules accepted: Orders

## 2020-11-05 NOTE — Addendum Note (Signed)
Addended by: Jon Billings on: 11/05/2020 07:50 AM   Modules accepted: Orders

## 2020-11-05 NOTE — Progress Notes (Signed)
Callus may be associated with prior hardware placed during repair of fracture. If this is bothering Abigail Webster, we can ask for an ankle specialist to see her.

## 2020-11-15 ENCOUNTER — Encounter: Payer: Self-pay | Admitting: Family Medicine

## 2020-11-15 ENCOUNTER — Ambulatory Visit (INDEPENDENT_AMBULATORY_CARE_PROVIDER_SITE_OTHER): Payer: Medicare Other | Admitting: Family Medicine

## 2020-11-15 ENCOUNTER — Other Ambulatory Visit: Payer: Self-pay

## 2020-11-15 VITALS — BP 132/76 | HR 58 | Temp 97.1°F | Ht 60.0 in | Wt 161.6 lb

## 2020-11-15 DIAGNOSIS — L304 Erythema intertrigo: Secondary | ICD-10-CM

## 2020-11-15 DIAGNOSIS — Z0000 Encounter for general adult medical examination without abnormal findings: Secondary | ICD-10-CM

## 2020-11-15 MED ORDER — FLUCONAZOLE 100 MG PO TABS: 100.0000 mg | ORAL_TABLET | Freq: Every day | ORAL | 0 refills | Status: AC

## 2020-11-15 MED ORDER — FLUCONAZOLE 100 MG PO TABS: 100.0000 mg | ORAL_TABLET | Freq: Every day | ORAL | 0 refills | Status: DC

## 2020-11-15 NOTE — Addendum Note (Signed)
Addended by: Abelino Derrick A on: 11/15/2020 01:09 PM   Modules accepted: Orders

## 2020-11-15 NOTE — Progress Notes (Addendum)
Established Patient Office Visit  Subjective:  Patient ID: Abigail Webster, female    DOB: [birth year]  Age: 60 y.o. MRN: [id number]  CC:  Chief Complaint  Patient presents with   Rash    Rash under breast and arms x 1 week becoming worse. Not sure if this was a reaction to chemo when patient had surgery.     HPI Abigail Webster presents for evaluation of a longstanding rash that seemed to start under her breast and has since expanded.  Rash is somewhat irritating.  Caregiver wonders if it might be related to single dose of chemotherapy given for recently discovered bladder tumor.  Past Medical History:  Diagnosis Date   Hypothyroidism    Thyroid disease     Past Surgical History:  Procedure Laterality Date   FOOT SURGERY     TRANSURETHRAL RESECTION OF BLADDER TUMOR WITH MITOMYCIN-C N/A 10/18/2020   Procedure: TRANSURETHRAL RESECTION OF BLADDER TUMOR WITH GEMCITABINE;  Surgeon: Lucas Mallow, MD;  Location: [facility];  Service: Urology;  Laterality: N/A;    Family History  Problem Relation Age of Onset   Pancreatic cancer Mother    Hypertension Mother    Hypothyroidism Mother    Colon cancer Father    Hypertension Father     Social History   Socioeconomic History   Marital status: Single    Spouse name: Not on file   Number of children: Not on file   Years of education: Not on file   Highest education level: Not on file  Occupational History   Not on file  Tobacco Use   Smoking status: Never   Smokeless tobacco: Never  Vaping Use   Vaping Use: Never used  Substance and Sexual Activity   Alcohol use: Never   Drug use: Never   Sexual activity: Never  Other Topics Concern   Not on file  Social History Narrative   Not on file   Social Determinants of Health   Financial Resource Strain: Not on file  Food Insecurity: Not on file  Transportation Needs: Not on file  Physical Activity: Not on file  Stress: Not on file  Social Connections: Not on file  Intimate  Partner Violence: Not on file    Outpatient Medications Prior to Visit  Medication Sig Dispense Refill   D3 SUPER STRENGTH 50 MCG (2000 UT) CAPS TAKE 1 CAPSULE BY MOUTH ONCE DAILY 30 capsule 3   Emollient (EUCERIN EX) Apply 1 application topically daily.     levothyroxine (SYNTHROID) 50 MCG tablet TAKE 1 TABLET BY MOUTH ONCE DAILY IN THE MORNING ON A FASTING STOMACH 1 HOUR BEFORE EATING 90 tablet 1   MYRBETRIQ 50 MG TB24 tablet Take 50 mg by mouth daily.     QUEtiapine (SEROQUEL XR) 50 MG TB24 24 hr tablet TAKE 1 TABLET BY MOUTH AT BEDTIME 30 tablet 2   REFRESH TEARS 0.5 % SOLN Place 1 drop into both eyes daily.     RESTASIS 0.05 % ophthalmic emulsion Place 1 drop into both eyes 2 (two) times daily.     Ergocalciferol (VITAMIN D2 PO)      No facility-administered medications prior to visit.    No Known Allergies  ROS Review of Systems  Constitutional: Negative.   Respiratory: Negative.    Cardiovascular: Negative.   Gastrointestinal: Negative.   Skin:  Positive for color change and rash.     Objective:    Physical Exam Vitals and nursing note reviewed.  Pulmonary:     Effort: Pulmonary effort is normal.  Skin:      Neurological:     Mental Status: She is alert.    BP 132/76   Pulse (!) 58   Temp (!) 97.1 F (36.2 C) (Temporal)   Ht 5' (1.524 m)   Wt 161 lb 9.6 oz (73.3 kg)   SpO2 98%   BMI 31.56 kg/m  Wt Readings from Last 3 Encounters:  11/15/20 161 lb 9.6 oz (73.3 kg)  11/03/20 159 lb 3.2 oz (72.2 kg)  10/18/20 159 lb (72.1 kg)     Health Maintenance Due  Topic Date Due   PAP SMEAR-Modifier  Never done   Zoster Vaccines- Shingrix (1 of 2) Never done   MAMMOGRAM  05/24/2019   COVID-19 Vaccine (4 - Booster for Pfizer series) 06/29/2020    There are no preventive care reminders to display for this patient.  Lab Results  Component Value Date   TSH 2.67 06/07/2020   Lab Results  Component Value Date   WBC 4.8 10/14/2020   HGB 13.2 10/14/2020    HCT 41.7 10/14/2020   MCV 95.2 10/14/2020   PLT 278 10/14/2020   Lab Results  Component Value Date   NA 142 05/16/2019   K 4.7 05/16/2019   CO2 25 05/16/2019   GLUCOSE 89 05/16/2019   BUN 19 05/16/2019   CREATININE 1.11 (H) 05/16/2019   BILITOT 0.5 05/16/2019   ALKPHOS 88 05/17/2017   AST 15 05/16/2019   ALT 9 05/16/2019   PROT 6.7 05/16/2019   ALBUMIN 4.3 05/17/2017   CALCIUM 9.3 05/16/2019   GFR 57.90 (L) 05/17/2017   Lab Results  Component Value Date   CHOL 200 05/17/2017   Lab Results  Component Value Date   HDL 74.20 05/17/2017   Lab Results  Component Value Date   LDLCALC 119 (H) 05/17/2017   Lab Results  Component Value Date   TRIG 37.0 05/17/2017   Lab Results  Component Value Date   CHOLHDL 3 05/17/2017   No results found for: HGBA1C    Assessment & Plan:   Problem List Items Addressed This Visit       Musculoskeletal and Integument   Intertrigo - Primary   Relevant Medications   fluconazole (DIFLUCAN) 100 MG tablet     Other   Healthcare maintenance   Relevant Orders   MM Digital Screening   Ambulatory referral to Gynecology   DG Bone Density    Meds ordered this encounter  Medications   DISCONTD: fluconazole (DIFLUCAN) 100 MG tablet    Sig: Take 1 tablet (100 mg total) by mouth daily for 7 days.    Dispense:  7 tablet    Refill:  0   DISCONTD: fluconazole (DIFLUCAN) 100 MG tablet    Sig: Take 1 tablet (100 mg total) by mouth daily for 10 days.    Dispense:  10 tablet    Refill:  0   fluconazole (DIFLUCAN) 100 MG tablet    Sig: Take 1 tablet (100 mg total) by mouth daily for 10 days.    Dispense:  10 tablet    Refill:  0    Follow-up: Return in about 2 weeks (around 11/29/2020).   Treat with 10 days of Diflucan due to extensive nature of this rash.  Refer to GYN Pap and pelvic exam.  Ordered mammogram and bone densitometry testing.  Do not believe that rash was associated with single course of chemotherapy.  Advised caregiver  that supporting and uplifting Bra would be helpful. [name], MD

## 2020-11-26 ENCOUNTER — Other Ambulatory Visit: Payer: Self-pay

## 2020-11-29 ENCOUNTER — Encounter: Payer: Self-pay | Admitting: Family Medicine

## 2020-11-29 ENCOUNTER — Other Ambulatory Visit: Payer: Self-pay

## 2020-11-29 ENCOUNTER — Telehealth: Payer: Self-pay | Admitting: Family Medicine

## 2020-11-29 ENCOUNTER — Ambulatory Visit (INDEPENDENT_AMBULATORY_CARE_PROVIDER_SITE_OTHER): Payer: Medicare Other | Admitting: Family Medicine

## 2020-11-29 VITALS — BP 118/68 | HR 60 | Temp 97.2°F | Ht 60.0 in | Wt 162.0 lb

## 2020-11-29 DIAGNOSIS — N6489 Other specified disorders of breast: Secondary | ICD-10-CM

## 2020-11-29 DIAGNOSIS — F79 Unspecified intellectual disabilities: Secondary | ICD-10-CM

## 2020-11-29 DIAGNOSIS — F99 Mental disorder, not otherwise specified: Secondary | ICD-10-CM

## 2020-11-29 DIAGNOSIS — R4689 Other symptoms and signs involving appearance and behavior: Secondary | ICD-10-CM | POA: Diagnosis not present

## 2020-11-29 DIAGNOSIS — F5105 Insomnia due to other mental disorder: Secondary | ICD-10-CM

## 2020-11-29 DIAGNOSIS — L304 Erythema intertrigo: Secondary | ICD-10-CM

## 2020-11-29 MED ORDER — QUETIAPINE FUMARATE 100 MG PO TABS: 100.0000 mg | ORAL_TABLET | Freq: Every day | ORAL | 1 refills | Status: DC

## 2020-11-29 NOTE — Telephone Encounter (Signed)
The pt is on this med with a '50mg'$  extended release, so is this one QUEtiapine (SEROQUEL) 100 MG tablet being discontinued or added. Please call [facility] at Phone:  [phone].

## 2020-11-29 NOTE — Telephone Encounter (Signed)
Returned call pharmacist and patients care taker aware of message below

## 2020-11-29 NOTE — Telephone Encounter (Signed)
Please advise message below  °

## 2020-11-29 NOTE — Progress Notes (Signed)
Established Patient Office Visit  Subjective:  Patient ID: Abigail Webster, female    DOB: November 11, 1960  Age: 60 y.o. MRN: [id number]  CC:  Chief Complaint  Patient presents with   Follow-up    2 week follow up on rash under breast, per care giver rash healing well. No concerns.     HPI Abigail Webster presents for follow-up of intertrigo, insomnia and behavior disorder.  Insomnia and behavior disorder only mildly improved on lower dose of Seroquel.  Intertrigo underneath the breasts has improved greatly with Diflucan.  Patient's breasts seem to be affecting her posture.  She has slumped over when she walks.  Past Medical History:  Diagnosis Date   Hypothyroidism    Thyroid disease     Past Surgical History:  Procedure Laterality Date   FOOT SURGERY     TRANSURETHRAL RESECTION OF BLADDER TUMOR WITH MITOMYCIN-C N/A 10/18/2020   Procedure: TRANSURETHRAL RESECTION OF BLADDER TUMOR WITH GEMCITABINE;  Surgeon: Lucas Mallow, MD;  Location: [facility];  Service: Urology;  Laterality: N/A;    Family History  Problem Relation Age of Onset   Pancreatic cancer Mother    Hypertension Mother    Hypothyroidism Mother    Colon cancer Father    Hypertension Father     Social History   Socioeconomic History   Marital status: Single    Spouse name: Not on file   Number of children: Not on file   Years of education: Not on file   Highest education level: Not on file  Occupational History   Not on file  Tobacco Use   Smoking status: Never   Smokeless tobacco: Never  Vaping Use   Vaping Use: Never used  Substance and Sexual Activity   Alcohol use: Never   Drug use: Never   Sexual activity: Never  Other Topics Concern   Not on file  Social History Narrative   Not on file   Social Determinants of Health   Financial Resource Strain: Not on file  Food Insecurity: Not on file  Transportation Needs: Not on file  Physical Activity: Not on file  Stress: Not on file  Social  Connections: Not on file  Intimate Partner Violence: Not on file    Outpatient Medications Prior to Visit  Medication Sig Dispense Refill   D3 SUPER STRENGTH 50 MCG (2000 UT) CAPS TAKE 1 CAPSULE BY MOUTH ONCE DAILY 30 capsule 3   Emollient (EUCERIN EX) Apply 1 application topically daily.     levothyroxine (SYNTHROID) 50 MCG tablet TAKE 1 TABLET BY MOUTH ONCE DAILY IN THE MORNING ON A FASTING STOMACH 1 HOUR BEFORE EATING 90 tablet 1   MYRBETRIQ 50 MG TB24 tablet Take 50 mg by mouth daily.     REFRESH TEARS 0.5 % SOLN Place 1 drop into both eyes daily.     RESTASIS 0.05 % ophthalmic emulsion Place 1 drop into both eyes 2 (two) times daily.     QUEtiapine (SEROQUEL XR) 50 MG TB24 24 hr tablet TAKE 1 TABLET BY MOUTH AT BEDTIME 30 tablet 2   No facility-administered medications prior to visit.    No Known Allergies  ROS Review of Systems  Constitutional: Negative.   HENT: Negative.    Eyes:  Positive for visual disturbance. Negative for photophobia.  Respiratory: Negative.    Cardiovascular: Negative.   Gastrointestinal: Negative.   Psychiatric/Behavioral:  Positive for behavioral problems and sleep disturbance.      Objective:  Physical Exam Vitals and nursing note reviewed.  Constitutional:      Appearance: Normal appearance.  HENT:     Head: Normocephalic and atraumatic.     Right Ear: External ear normal.     Left Ear: External ear normal.  Pulmonary:     Effort: Pulmonary effort is normal.  Chest:    Neurological:     Mental Status: She is alert. Mental status is at baseline.  Psychiatric:        Mood and Affect: Mood normal.        Behavior: Behavior normal.    BP 118/68 (BP Location: Right Arm, Patient Position: Sitting, Cuff Size: Normal)   Pulse 60   Temp (!) 97.2 F (36.2 C) (Temporal)   Ht 5' (1.524 m)   Wt 162 lb (73.5 kg)   SpO2 100%   BMI 31.64 kg/m  Wt Readings from Last 3 Encounters:  11/29/20 162 lb (73.5 kg)  11/15/20 161 lb 9.6 oz (73.3  kg)  11/03/20 159 lb 3.2 oz (72.2 kg)     Health Maintenance Due  Topic Date Due   PAP SMEAR-Modifier  Never done   Zoster Vaccines- Shingrix (1 of 2) Never done   MAMMOGRAM  05/24/2019   INFLUENZA VACCINE  11/29/2020    There are no preventive care reminders to display for this patient.  Lab Results  Component Value Date   TSH 2.67 06/07/2020   Lab Results  Component Value Date   WBC 4.8 10/14/2020   HGB 13.2 10/14/2020   HCT 41.7 10/14/2020   MCV 95.2 10/14/2020   PLT 278 10/14/2020   Lab Results  Component Value Date   NA 142 05/16/2019   K 4.7 05/16/2019   CO2 25 05/16/2019   GLUCOSE 89 05/16/2019   BUN 19 05/16/2019   CREATININE 1.11 (H) 05/16/2019   BILITOT 0.5 05/16/2019   ALKPHOS 88 05/17/2017   AST 15 05/16/2019   ALT 9 05/16/2019   PROT 6.7 05/16/2019   ALBUMIN 4.3 05/17/2017   CALCIUM 9.3 05/16/2019   GFR 57.90 (L) 05/17/2017   Lab Results  Component Value Date   CHOL 200 05/17/2017   Lab Results  Component Value Date   HDL 74.20 05/17/2017   Lab Results  Component Value Date   LDLCALC 119 (H) 05/17/2017   Lab Results  Component Value Date   TRIG 37.0 05/17/2017   Lab Results  Component Value Date   CHOLHDL 3 05/17/2017   No results found for: HGBA1C    Assessment & Plan:   Problem List Items Addressed This Visit       Musculoskeletal and Integument   Intertrigo     Other   Mentally challenged - Primary   Behavioral change   Relevant Medications   QUEtiapine (SEROQUEL) 100 MG tablet   Insomnia due to other mental disorder   Relevant Medications   QUEtiapine (SEROQUEL) 100 MG tablet   Other Visit Diagnoses     Bilateral pendulous breasts       Relevant Orders   Ambulatory referral to General Surgery       Meds ordered this encounter  Medications   QUEtiapine (SEROQUEL) 100 MG tablet    Sig: Take 1 tablet (100 mg total) by mouth at bedtime.    Dispense:  90 tablet    Refill:  1     Follow-up: Return in  about 3 months (around 03/01/2021).  Increased Seroquel 200 mg nightly.  And concern that intertrigo may  return.  Advised to lift up brawl.  General surgery referral for consideration of breast reduction surgery.  [name], MD

## 2021-01-14 ENCOUNTER — Ambulatory Visit (INDEPENDENT_AMBULATORY_CARE_PROVIDER_SITE_OTHER): Payer: Medicare Other | Admitting: Podiatry

## 2021-01-14 ENCOUNTER — Encounter: Payer: Self-pay | Admitting: Podiatry

## 2021-01-14 ENCOUNTER — Other Ambulatory Visit: Payer: Self-pay

## 2021-01-14 DIAGNOSIS — M79674 Pain in right toe(s): Secondary | ICD-10-CM | POA: Diagnosis not present

## 2021-01-14 DIAGNOSIS — B351 Tinea unguium: Secondary | ICD-10-CM

## 2021-01-14 DIAGNOSIS — M79675 Pain in left toe(s): Secondary | ICD-10-CM

## 2021-01-14 DIAGNOSIS — L84 Corns and callosities: Secondary | ICD-10-CM

## 2021-01-14 NOTE — Progress Notes (Signed)
Complaint:  Visit Type: Patient returns to my office for continued preventative foot care services.  Patient states" my nails have grown long and thick and become painful to walk and wear shoes".    The patient presents for preventative foot care services. She presents to the office with her caregiver.    Podiatric Exam: Vascular: dorsalis pedis and posterior tibial pulses are palpable bilateral. Capillary return is immediate. Temperature gradient is WNL. Skin turgor WNL  Sensorium: Normal Semmes Weinstein monofilament test. Normal tactile sensation bilaterally. Nail Exam: Pt has thick disfigured discolored nails with subungual debris noted bilateral entire nail hallux through fifth toenails Ulcer Exam: There is no evidence of ulcer or pre-ulcerative changes or infection. Orthopedic Exam: Muscle tone and strength are WNL. No limitations in general ROM. No crepitus or effusions noted. Foot type and digits show no abnormalities. Bony prominences are unremarkable. Pes planus. Skin: No Porokeratosis. No infection or ulcers.  Pinch callus right symptomatic.  Diagnosis:  Onychomycosis, , Pain in right toe, pain in left toes,  Pinch callus right hallux  Treatment & Plan Procedures and Treatment: Consent by patient was obtained for treatment procedures.   Debridement of mycotic and hypertrophic toenails, 1 through 5 bilateral and clearing of subungual debris using nail nipper followed by dremel..Debride callus with # 15 blade.  Patient has callus lateral malleolus left ankle. No ulceration, no infection noted.  Return Visit-Office Procedure: Patient instructed to return to the office for a follow up visit 3 months for continued evaluation and treatment.    Gardiner Barefoot DPM

## 2021-01-24 ENCOUNTER — Ambulatory Visit
Admission: RE | Admit: 2021-01-24 | Discharge: 2021-01-24 | Disposition: A | Discharge status: Home / Self Care | Payer: Medicare Other | Source: Ambulatory Visit | Attending: Family Medicine | Admitting: Family Medicine

## 2021-01-24 ENCOUNTER — Other Ambulatory Visit: Payer: Self-pay

## 2021-01-24 DIAGNOSIS — Z0000 Encounter for general adult medical examination without abnormal findings: Secondary | ICD-10-CM

## 2021-01-25 ENCOUNTER — Other Ambulatory Visit: Payer: Self-pay | Admitting: Family Medicine

## 2021-01-25 DIAGNOSIS — E559 Vitamin D deficiency, unspecified: Secondary | ICD-10-CM

## 2021-02-07 ENCOUNTER — Other Ambulatory Visit: Payer: Self-pay

## 2021-02-07 ENCOUNTER — Ambulatory Visit (INDEPENDENT_AMBULATORY_CARE_PROVIDER_SITE_OTHER): Payer: Medicare Other | Admitting: Family Medicine

## 2021-02-07 ENCOUNTER — Encounter: Payer: Self-pay | Admitting: Family Medicine

## 2021-02-07 VITALS — BP 138/76 | HR 62 | Temp 98.2°F | Ht 60.0 in | Wt 168.0 lb

## 2021-02-07 DIAGNOSIS — Z0000 Encounter for general adult medical examination without abnormal findings: Secondary | ICD-10-CM | POA: Diagnosis not present

## 2021-02-07 DIAGNOSIS — E039 Hypothyroidism, unspecified: Secondary | ICD-10-CM | POA: Diagnosis not present

## 2021-02-07 DIAGNOSIS — Z7289 Other problems related to lifestyle: Secondary | ICD-10-CM

## 2021-02-07 DIAGNOSIS — F99 Mental disorder, not otherwise specified: Secondary | ICD-10-CM

## 2021-02-07 DIAGNOSIS — Z23 Encounter for immunization: Secondary | ICD-10-CM | POA: Diagnosis not present

## 2021-02-07 DIAGNOSIS — F5105 Insomnia due to other mental disorder: Secondary | ICD-10-CM | POA: Diagnosis not present

## 2021-02-07 LAB — COMPREHENSIVE METABOLIC PANEL
ALT: 13 U/L (ref 0–35)
AST: 19 U/L (ref 0–37)
Albumin: 4.3 g/dL (ref 3.5–5.2)
Alkaline Phosphatase: 88 U/L (ref 39–117)
BUN: 19 mg/dL (ref 6–23)
CO2: 28 mEq/L (ref 19–32)
Calcium: 9.3 mg/dL (ref 8.4–10.5)
Chloride: 108 mEq/L (ref 96–112)
Creatinine, Ser: 1.36 mg/dL — ABNORMAL HIGH (ref 0.40–1.20)
GFR: 42.31 mL/min — ABNORMAL LOW (ref >60.00)
Glucose, Bld: 83 mg/dL (ref 70–99)
Potassium: 4.4 mEq/L (ref 3.5–5.1)
Sodium: 144 mEq/L (ref 135–145)
Total Bilirubin: 0.7 mg/dL (ref 0.2–1.2)
Total Protein: 7 g/dL (ref 6.0–8.3)

## 2021-02-07 LAB — CBC
HCT: 38.6 % (ref 36.0–46.0)
Hemoglobin: 12.6 g/dL (ref 12.0–15.0)
MCHC: 32.7 g/dL (ref 30.0–36.0)
MCV: 92.4 fl (ref 78.0–100.0)
Platelets: 295 10*3/uL (ref 150.0–400.0)
RBC: 4.18 Mil/uL (ref 3.87–5.11)
RDW: 14.4 % (ref 11.5–15.5)
WBC: 5.7 10*3/uL (ref 4.0–10.5)

## 2021-02-07 LAB — TSH: TSH: 2.52 u[IU]/mL (ref 0.35–5.50)

## 2021-02-07 LAB — LDL CHOLESTEROL, DIRECT: Direct LDL: 90 mg/dL

## 2021-02-07 MED ORDER — QUETIAPINE FUMARATE 100 MG PO TABS: 100.0000 mg | ORAL_TABLET | Freq: Every day | ORAL | 1 refills | Status: DC

## 2021-02-07 NOTE — Progress Notes (Signed)
Established Patient Office Visit  Subjective:  Patient ID: Abigail Webster, female    DOB: [birth year]  Age: 60 y.o. MRN: [id number]  CC:  Chief Complaint  Patient presents with   Follow-up    3 month follow up, no concerns. Patient not fasting.     HPI Abigail Webster presents for follow-up of hypothyroidism, insomnia, behavior issues and health maintenance.  Nonfasting this morning.  Intertrigo rash of breast is cleared.  Her niece brought her a new bra that prevent skin to skin contact.  She is sleeping better.  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  Family has been resistant to psychoactive medications.  Past Medical History:  Diagnosis Date   Hypothyroidism    Thyroid disease     Past Surgical History:  Procedure Laterality Date   FOOT SURGERY     TRANSURETHRAL RESECTION OF BLADDER TUMOR WITH MITOMYCIN-C N/A 10/18/2020   Procedure: TRANSURETHRAL RESECTION OF BLADDER TUMOR WITH GEMCITABINE;  Surgeon: Lucas Mallow, MD;  Location: [facility];  Service: Urology;  Laterality: N/A;    Family History  Problem Relation Age of Onset   Pancreatic cancer Mother    Hypertension Mother    Hypothyroidism Mother    Colon cancer Father    Hypertension Father     Social History   Socioeconomic History   Marital status: Single    Spouse name: Not on file   Number of children: Not on file   Years of education: Not on file   Highest education level: Not on file  Occupational History   Not on file  Tobacco Use   Smoking status: Never   Smokeless tobacco: Never  Vaping Use   Vaping Use: Never used  Substance and Sexual Activity   Alcohol use: Never   Drug use: Never   Sexual activity: Never  Other Topics Concern   Not on file  Social History Narrative   Not on file   Social Determinants of Health   Financial Resource Strain: Not on file  Food Insecurity: Not on file  Transportation Needs: Not on file   Physical Activity: Not on file  Stress: Not on file  Social Connections: Not on file  Intimate Partner Violence: Not on file    Outpatient Medications Prior to Visit  Medication Sig Dispense Refill   D3 SUPER STRENGTH 50 MCG (2000 UT) CAPS TAKE 1 CAPSULE BY MOUTH ONCE DAILY 30 capsule 3   Emollient (EUCERIN EX) Apply 1 application topically daily.     levothyroxine (SYNTHROID) 50 MCG tablet TAKE 1 TABLET BY MOUTH ONCE DAILY IN THE MORNING ON A FASTING STOMACH 1 HOUR BEFORE EATING 90 tablet 1   MYRBETRIQ 50 MG TB24 tablet Take 50 mg by mouth daily.     REFRESH TEARS 0.5 % SOLN Place 1 drop into both eyes daily.     RESTASIS 0.05 % ophthalmic emulsion Place 1 drop into both eyes 2 (two) times daily.     QUEtiapine (SEROQUEL) 100 MG tablet Take 1 tablet (100 mg total) by mouth at bedtime. 90 tablet 1   No facility-administered medications prior to visit.    No Known Allergies  ROS Review of Systems  Constitutional:  Negative for diaphoresis, fatigue, fever and unexpected weight change.  HENT: Negative.    Eyes:  Positive for visual disturbance.  Respiratory: Negative.    Cardiovascular: Negative.   Gastrointestinal: Negative.   Genitourinary: Negative.  Psychiatric/Behavioral:  Positive for behavioral problems and sleep disturbance.      Objective:    Physical Exam Vitals and nursing note reviewed.  Constitutional:      General: She is not in acute distress.    Appearance: She is not ill-appearing or toxic-appearing.  HENT:     Head: Normocephalic and atraumatic.     Right Ear: Tympanic membrane, ear canal and external ear normal.     Left Ear: Tympanic membrane, ear canal and external ear normal.     Mouth/Throat:     Mouth: Mucous membranes are moist.     Dentition: Abnormal dentition.     Pharynx: Oropharynx is clear. No oropharyngeal exudate or posterior oropharyngeal erythema.  Eyes:     General: No scleral icterus.       Right eye: No discharge.        Left  eye: No discharge.     Conjunctiva/sclera: Conjunctivae normal.  Cardiovascular:     Rate and Rhythm: Normal rate and regular rhythm.  Pulmonary:     Effort: Pulmonary effort is normal.     Breath sounds: Normal breath sounds.  Musculoskeletal:     Cervical back: No rigidity or tenderness.  Lymphadenopathy:     Cervical: No cervical adenopathy.  Skin:    General: Skin is warm and dry.  Neurological:     Mental Status: She is alert and oriented to person, place, and time.  Psychiatric:        Mood and Affect: Mood normal.        Behavior: Behavior normal.    BP 138/76 (BP Location: Right Arm, Patient Position: Sitting, Cuff Size: Normal)   Pulse 62   Temp 98.2 F (36.8 C) (Temporal)   Ht 5' (1.524 m)   Wt 168 lb (76.2 kg)   SpO2 99%   BMI 32.81 kg/m  Wt Readings from Last 3 Encounters:  02/07/21 168 lb (76.2 kg)  11/29/20 162 lb (73.5 kg)  11/15/20 161 lb 9.6 oz (73.3 kg)     Health Maintenance Due  Topic Date Due   PAP SMEAR-Modifier  Never done   Zoster Vaccines- Shingrix (1 of 2) Never done    There are no preventive care reminders to display for this patient.  Lab Results  Component Value Date   TSH 2.67 06/07/2020   Lab Results  Component Value Date   WBC 4.8 10/14/2020   HGB 13.2 10/14/2020   HCT 41.7 10/14/2020   MCV 95.2 10/14/2020   PLT 278 10/14/2020   Lab Results  Component Value Date   NA 142 05/16/2019   K 4.7 05/16/2019   CO2 25 05/16/2019   GLUCOSE 89 05/16/2019   BUN 19 05/16/2019   CREATININE 1.11 (H) 05/16/2019   BILITOT 0.5 05/16/2019   ALKPHOS 88 05/17/2017   AST 15 05/16/2019   ALT 9 05/16/2019   PROT 6.7 05/16/2019   ALBUMIN 4.3 05/17/2017   CALCIUM 9.3 05/16/2019   GFR 57.90 (L) 05/17/2017   Lab Results  Component Value Date   CHOL 200 05/17/2017   Lab Results  Component Value Date   HDL 74.20 05/17/2017   Lab Results  Component Value Date   LDLCALC 119 (H) 05/17/2017   Lab Results  Component Value Date    TRIG 37.0 05/17/2017   Lab Results  Component Value Date   CHOLHDL 3 05/17/2017   No results found for: HGBA1C    Assessment & Plan:   Problem List Items Addressed  This Visit       Endocrine   Acquired hypothyroidism   Relevant Orders   TSH     Other   Healthcare maintenance   Relevant Orders   CBC   Comprehensive metabolic panel   LDL cholesterol, direct   Self-mutilation   Relevant Medications   QUEtiapine (SEROQUEL) 100 MG tablet   Insomnia due to other mental disorder   Relevant Medications   QUEtiapine (SEROQUEL) 100 MG tablet   Other Visit Diagnoses     Need for influenza vaccination    -  Primary   Relevant Orders   Flu Vaccine QUAD 6+ mos PF IM (Fluarix Quad PF) (Completed)       Meds ordered this encounter  Medications   QUEtiapine (SEROQUEL) 100 MG tablet    Sig: Take 1 tablet (100 mg total) by mouth at bedtime.    Dispense:  90 tablet    Refill:  1     Follow-up: Return in about 6 months (around 08/08/2021), or if symptoms worsen or fail to improve.  Discussed increasing Seroquel and/or augmentation therapy.  Caregiver believes that things are manageable as they are and is also concerned about pushback from patient's family.  Continue current dose of Seroquel.  [name], MD

## 2021-02-17 ENCOUNTER — Telehealth: Payer: Self-pay | Admitting: Family Medicine

## 2021-02-17 NOTE — Telephone Encounter (Signed)
Left message for patient to call back and schedule Medicare Annual Wellness Visit (AWV) in office.   If not able to come in office, please offer to do virtually or by telephone.  Left office number and my jabber [phone].  Last AWV:11/13/2019  Please schedule at anytime with Nurse Health Advisor.

## 2021-03-02 ENCOUNTER — Ambulatory Visit: Payer: Medicare Other

## 2021-03-09 ENCOUNTER — Ambulatory Visit (INDEPENDENT_AMBULATORY_CARE_PROVIDER_SITE_OTHER): Payer: Medicare Other

## 2021-03-09 ENCOUNTER — Other Ambulatory Visit: Payer: Self-pay

## 2021-03-09 VITALS — BP 142/70 | HR 72 | Temp 98.0°F | Ht 62.0 in | Wt 169.0 lb

## 2021-03-09 DIAGNOSIS — Z0000 Encounter for general adult medical examination without abnormal findings: Secondary | ICD-10-CM | POA: Diagnosis not present

## 2021-03-09 NOTE — Patient Instructions (Addendum)
[name] , Thank you for taking time to come for your Medicare Wellness Visit. I appreciate your ongoing commitment to your health goals. Please review the following plan we discussed and let me know if I can assist you in the future.   Screening recommendations/referrals: Colonoscopy: 05/01/2013 Mammogram: 01/24/2021 Bone Density: not of age  Recommended yearly ophthalmology/optometry visit for glaucoma screening and checkup Recommended yearly dental visit for hygiene and checkup  Vaccinations: Influenza vaccine: completed  Pneumococcal vaccine: will consider  Tdap vaccine: 05/16/2019 Shingles vaccine: not of age     Advanced directives: none   Conditions/risks identified: none   Next appointment: none    Preventive Care 63 Years and Older, Female Preventive care refers to lifestyle choices and visits with your health care provider that can promote health and wellness. What does preventive care include? A yearly physical exam. This is also called an annual well check. Dental exams once or twice a year. Routine eye exams. Ask your health care provider how often you should have your eyes checked. Personal lifestyle choices, including: Daily care of your teeth and gums. Regular physical activity. Eating a healthy diet. Avoiding tobacco and drug use. Limiting alcohol use. Practicing safe sex. Taking low-dose aspirin every day. Taking vitamin and mineral supplements as recommended by your health care provider. What happens during an annual well check? The services and screenings done by your health care provider during your annual well check will depend on your age, overall health, lifestyle risk factors, and family history of disease. Counseling  Your health care provider may ask you questions about your: Alcohol use. Tobacco use. Drug use. Emotional well-being. Home and relationship well-being. Sexual activity. Eating habits. History of falls. Memory and ability to  understand (cognition). Work and work Statistician. Reproductive health. Screening  You may have the following tests or measurements: Height, weight, and BMI. Blood pressure. Lipid and cholesterol levels. These may be checked every 5 years, or more frequently if you are over 4 years old. Skin check. Lung cancer screening. You may have this screening every year starting at age 33 if you have a 30-pack-year history of smoking and currently smoke or have quit within the past 15 years. Fecal occult blood test (FOBT) of the stool. You may have this test every year starting at age 96. Flexible sigmoidoscopy or colonoscopy. You may have a sigmoidoscopy every 5 years or a colonoscopy every 10 years starting at age 69. Hepatitis C blood test. Hepatitis B blood test. Sexually transmitted disease (STD) testing. Diabetes screening. This is done by checking your blood sugar (glucose) after you have not eaten for a while (fasting). You may have this done every 1-3 years. Bone density scan. This is done to screen for osteoporosis. You may have this done starting at age 76. Mammogram. This may be done every 1-2 years. Talk to your health care provider about how often you should have regular mammograms. Talk with your health care provider about your test results, treatment options, and if necessary, the need for more tests. Vaccines  Your health care provider may recommend certain vaccines, such as: Influenza vaccine. This is recommended every year. Tetanus, diphtheria, and acellular pertussis (Tdap, Td) vaccine. You may need a Td booster every 10 years. Zoster vaccine. You may need this after age 52. Pneumococcal 13-valent conjugate (PCV13) vaccine. One dose is recommended after age 23. Pneumococcal polysaccharide (PPSV23) vaccine. One dose is recommended after age 31. Talk to your health care provider about which screenings and vaccines  you need and how often you need them. This information is not  intended to replace advice given to you by your health care provider. Make sure you discuss any questions you have with your health care provider. Document Released: 05/14/2015 Document Revised: 01/05/2016 Document Reviewed: 02/16/2015 Elsevier Interactive Patient Education  2017 [place] Prevention in the Home Falls can cause injuries. They can happen to people of all ages. There are many things you can do to make your home safe and to help prevent falls. What can I do on the outside of my home? Regularly fix the edges of walkways and driveways and fix any cracks. Remove anything that might make you trip as you walk through a door, such as a raised step or threshold. Trim any bushes or trees on the path to your home. Use bright outdoor lighting. Clear any walking paths of anything that might make someone trip, such as rocks or tools. Regularly check to see if handrails are loose or broken. Make sure that both sides of any steps have handrails. Any raised decks and porches should have guardrails on the edges. Have any leaves, snow, or ice cleared regularly. Use sand or salt on walking paths during winter. Clean up any spills in your garage right away. This includes oil or grease spills. What can I do in the bathroom? Use night lights. Install grab bars by the toilet and in the tub and shower. Do not use towel bars as grab bars. Use non-skid mats or decals in the tub or shower. If you need to sit down in the shower, use a plastic, non-slip stool. Keep the floor dry. Clean up any water that spills on the floor as soon as it happens. Remove soap buildup in the tub or shower regularly. Attach bath mats securely with double-sided non-slip rug tape. Do not have throw rugs and other things on the floor that can make you trip. What can I do in the bedroom? Use night lights. Make sure that you have a light by your bed that is easy to reach. Do not use any sheets or blankets that are  too big for your bed. They should not hang down onto the floor. Have a firm chair that has side arms. You can use this for support while you get dressed. Do not have throw rugs and other things on the floor that can make you trip. What can I do in the kitchen? Clean up any spills right away. Avoid walking on wet floors. Keep items that you use a lot in easy-to-reach places. If you need to reach something above you, use a strong step stool that has a grab bar. Keep electrical cords out of the way. Do not use floor polish or wax that makes floors slippery. If you must use wax, use non-skid floor wax. Do not have throw rugs and other things on the floor that can make you trip. What can I do with my stairs? Do not leave any items on the stairs. Make sure that there are handrails on both sides of the stairs and use them. Fix handrails that are broken or loose. Make sure that handrails are as long as the stairways. Check any carpeting to make sure that it is firmly attached to the stairs. Fix any carpet that is loose or worn. Avoid having throw rugs at the top or bottom of the stairs. If you do have throw rugs, attach them to the floor with carpet tape. Make sure  that you have a light switch at the top of the stairs and the bottom of the stairs. If you do not have them, ask someone to add them for you. What else can I do to help prevent falls? Wear shoes that: Do not have high heels. Have rubber bottoms. Are comfortable and fit you well. Are closed at the toe. Do not wear sandals. If you use a stepladder: Make sure that it is fully opened. Do not climb a closed stepladder. Make sure that both sides of the stepladder are locked into place. Ask someone to hold it for you, if possible. Clearly mark and make sure that you can see: Any grab bars or handrails. First and last steps. Where the edge of each step is. Use tools that help you move around (mobility aids) if they are needed. These  include: Canes. Walkers. Scooters. Crutches. Turn on the lights when you go into a dark area. Replace any light bulbs as soon as they burn out. Set up your furniture so you have a clear path. Avoid moving your furniture around. If any of your floors are uneven, fix them. If there are any pets around you, be aware of where they are. Review your medicines with your doctor. Some medicines can make you feel dizzy. This can increase your chance of falling. Ask your doctor what other things that you can do to help prevent falls. This information is not intended to replace advice given to you by your health care provider. Make sure you discuss any questions you have with your health care provider. Document Released: 02/11/2009 Document Revised: 09/23/2015 Document Reviewed: 05/22/2014 Elsevier Interactive Patient Education  2017 [employer].

## 2021-03-09 NOTE — Progress Notes (Signed)
Subjective:   Abigail Webster is a 60 y.o. female who presents for Medicare Annual (Subsequent) preventive examination.  Review of Systems           Objective:    Today's Vitals   03/09/21 1509  BP: (!) 142/70  Pulse: 72  Temp: 98 F (36.7 C)  SpO2: 96%  Weight: 169 lb (76.7 kg)  Height: 5\' 2"  (1.575 m)   Body mass index is 30.91 kg/m.  Advanced Directives 03/09/2021 10/14/2020 11/13/2019  Does Patient Have a Medical Advance Directive? No No Yes  Type of Advance Directive - Public librarian;Living will  Copy of [place] in Chart? - - No - copy requested  Would patient like information on creating a medical advance directive? No - Patient declined - -    Current Medications (verified) Outpatient Encounter Medications as of 03/09/2021  Medication Sig   D3 SUPER STRENGTH 50 MCG (2000 UT) CAPS TAKE 1 CAPSULE BY MOUTH ONCE DAILY   Emollient (EUCERIN EX) Apply 1 application topically daily.   levothyroxine (SYNTHROID) 50 MCG tablet TAKE 1 TABLET BY MOUTH ONCE DAILY IN THE MORNING ON A FASTING STOMACH 1 HOUR BEFORE EATING   MYRBETRIQ 50 MG TB24 tablet Take 50 mg by mouth daily.   QUEtiapine (SEROQUEL) 100 MG tablet Take 1 tablet (100 mg total) by mouth at bedtime.   REFRESH TEARS 0.5 % SOLN Place 1 drop into both eyes daily.   RESTASIS 0.05 % ophthalmic emulsion Place 1 drop into both eyes 2 (two) times daily.   No facility-administered encounter medications on file as of 03/09/2021.    Allergies (verified) Patient has no known allergies.   History: Past Medical History:  Diagnosis Date   Hypothyroidism    Thyroid disease    Past Surgical History:  Procedure Laterality Date   FOOT SURGERY     TRANSURETHRAL RESECTION OF BLADDER TUMOR WITH MITOMYCIN-C N/A 10/18/2020   Procedure: TRANSURETHRAL RESECTION OF BLADDER TUMOR WITH GEMCITABINE;  Surgeon: Lucas Mallow, MD;  Location: [facility];  Service: Urology;  Laterality: N/A;   Family  History  Problem Relation Age of Onset   Pancreatic cancer Mother    Hypertension Mother    Hypothyroidism Mother    Colon cancer Father    Hypertension Father    Social History   Socioeconomic History   Marital status: Single    Spouse name: Not on file   Number of children: Not on file   Years of education: Not on file   Highest education level: Not on file  Occupational History   Not on file  Tobacco Use   Smoking status: Never   Smokeless tobacco: Never  Vaping Use   Vaping Use: Never used  Substance and Sexual Activity   Alcohol use: Never   Drug use: Never   Sexual activity: Never  Other Topics Concern   Not on file  Social History Narrative   Not on file   Social Determinants of Health   Financial Resource Strain: Low Risk    Difficulty of Paying Living Expenses: Not hard at all  Food Insecurity: No Food Insecurity   Worried About Charity fundraiser in the Last Year: Never true   [place] in the Last Year: Never true  Transportation Needs: No Transportation Needs   Lack of Transportation (Medical): No   Lack of Transportation (Non-Medical): No  Physical Activity: Insufficiently Active   Days of Exercise per  Week: 3 days   Minutes of Exercise per Session: 30 min  Stress: No Stress Concern Present   Feeling of Stress : Not at all  Social Connections: Moderately Integrated   Frequency of Communication with Friends and Family: Twice a week   Frequency of Social Gatherings with Friends and Family: Twice a week   Attends Religious Services: More than 4 times per year   Active Member of [employer] or Organizations: Yes   Attends Music therapist: More than 4 times per year   Marital Status: Never married    Tobacco Counseling Counseling given: Not Answered   Clinical Intake:  Pre-visit preparation completed: Yes  Pain : No/denies pain     Nutritional Risks: None Diabetes: No  How often do you need to have someone help you when you  read instructions, pamphlets, or other written materials from your doctor or pharmacy?: 1 - Never What is the last grade level you completed in school?: some High School  Diabetic?no   Interpreter Needed?: No  Information entered by :: [place] of Daily Living In your present state of health, do you have any difficulty performing the following activities: 10/14/2020  Hearing? N  Vision? Y  Difficulty concentrating or making decisions? Y  Walking or climbing stairs? Y  Dressing or bathing? Y  Doing errands, shopping? N  Some recent data might be hidden    Patient Care Team: [name], MD as PCP - General (Family Medicine)  Indicate any recent Medical Services you may have received from other than [facility] providers in the past year (date may be approximate).     Assessment:   This is a routine wellness examination for [name].  Hearing/Vision screen Vision Screening - Comments:: Annual eye exams wears glasses   Dietary issues and exercise activities discussed:     Goals Addressed   None    Depression Screen PHQ 2/9 Scores 03/09/2021 02/07/2021 11/29/2020 11/15/2020 11/03/2020 08/18/2020 06/07/2020  PHQ - 2 Score 0 0 0 0 0 0 0    Fall Risk Fall Risk  03/09/2021 02/07/2021 11/29/2020 11/15/2020 11/03/2020  Falls in the past year? 0 0 0 0 0  Number falls in past yr: 0 0 - - -  Injury with Fall? 0 - - - -  Comment - - - - -  Follow up Falls evaluation completed - - - -    FALL RISK PREVENTION PERTAINING TO THE HOME:  Any stairs in or around the home? No  If so, are there any without handrails? No  Home free of loose throw rugs in walkways, pet beds, electrical cords, etc? Yes  Adequate lighting in your home to reduce risk of falls? Yes   ASSISTIVE DEVICES UTILIZED TO PREVENT FALLS:  Life alert? No  Use of a cane, walker or w/c? No  Grab bars in the bathroom? No  Shower chair or bench in shower? No  Elevated toilet seat or a handicapped toilet? No    TIMED UP AND GO:  Was the test performed? Yes .  Length of time to ambulate 10 feet: 10 sec.   Gait slow and steady without use of assistive device  Cognitive Function:  Cognitive status assessed by direct observation. Patient has current diagnosis of cognitive impairment. Patient is followed by neurology for ongoing assessment. Patient is unable to complete screening 6CIT or MMSE.  Accompanied by Posey Rea care giver [facility]       Immunizations Immunization  History  Administered Date(s) Administered   Influenza,inj,Quad PF,6+ Mos 05/17/2017, 02/12/2018, 01/08/2019, 02/13/2020, 02/07/2021   PFIZER(Purple Top)SARS-COV-2 Vaccination 07/24/2019, 08/14/2019, 03/01/2020   Tdap 05/16/2019    TDAP status: Up to date  Flu Vaccine status: Up to date  Pneumococcal vaccine status: Due, Education has been provided regarding the importance of this vaccine. Advised may receive this vaccine at local pharmacy or Health Dept. Aware to provide a copy of the vaccination record if obtained from local pharmacy or Health Dept. Verbalized acceptance and understanding.  Covid-19 vaccine status: Completed vaccines  Qualifies for Shingles Vaccine? Yes   Zostavax completed Yes   Shingrix Completed?: No.    Education has been provided regarding the importance of this vaccine. Patient has been advised to call insurance company to determine out of pocket expense if they have not yet received this vaccine. Advised may also receive vaccine at local pharmacy or Health Dept. Verbalized acceptance and understanding.  Screening Tests Health Maintenance  Topic Date Due   PAP SMEAR-Modifier  Never done   Zoster Vaccines- Shingrix (1 of 2) Never done   MAMMOGRAM  01/24/2022   COLONOSCOPY (Pts 45-33yrs Insurance coverage will need to be confirmed)  05/02/2023   TETANUS/TDAP  05/15/2029   INFLUENZA VACCINE  Completed   Hepatitis C Screening  Completed   Pneumococcal Vaccine 65-45 Years old   Aged Out   HPV VACCINES  Aged Out   COVID-19 Vaccine  Discontinued   HIV Screening  Discontinued    Health Maintenance  Health Maintenance Due  Topic Date Due   PAP SMEAR-Modifier  Never done   Zoster Vaccines- Shingrix (1 of 2) Never done    Colorectal cancer screening: Type of screening: Colonoscopy. Completed 05/01/2013. Repeat every 10 years  Mammogram status: Completed 01/24/2021. Repeat every year  Bone Density status: Ordered not of age . Pt provided with contact info and advised to call to schedule appt.  Lung Cancer Screening: (Low Dose CT Chest recommended if Age 31-80 years, 30 pack-year currently smoking OR have quit w/in 15years.) does not qualify.   Lung Cancer Screening Referral: n/a  Additional Screening:  Hepatitis C Screening: does not qualify; Completed 05/16/2019  Vision Screening: Recommended annual ophthalmology exams for early detection of glaucoma and other disorders of the eye. Is the patient up to date with their annual eye exam?  Yes  Who is the provider or what is the name of the office in which the patient attends annual eye exams? [facility]  If pt is not established with a provider, would they like to be referred to a provider to establish care? No .   Dental Screening: Recommended annual dental exams for proper oral hygiene  Community Resource Referral / Chronic Care Management: CRR required this visit?  No   CCM required this visit?  No      Plan:     I have personally reviewed and noted the following in the patient's chart:   Medical and social history Use of alcohol, tobacco or illicit drugs  Current medications and supplements including opioid prescriptions.  Functional ability and status Nutritional status Physical activity Advanced directives List of other physicians Hospitalizations, surgeries, and ER visits in previous 12 months Vitals Screenings to include cognitive, depression, and falls Referrals and  appointments  In addition, I have reviewed and discussed with patient certain preventive protocols, quality metrics, and best practice recommendations. A written personalized care plan for preventive services as well as general preventive health recommendations were provided to  patient.     Randel Pigg, LPN   08/0/2233   Nurse Notes: none

## 2021-04-06 ENCOUNTER — Other Ambulatory Visit: Payer: Self-pay | Admitting: Family Medicine

## 2021-04-06 DIAGNOSIS — E039 Hypothyroidism, unspecified: Secondary | ICD-10-CM

## 2021-04-19 ENCOUNTER — Encounter: Payer: Self-pay | Admitting: Podiatry

## 2021-04-19 ENCOUNTER — Other Ambulatory Visit: Payer: Self-pay

## 2021-04-19 ENCOUNTER — Ambulatory Visit (INDEPENDENT_AMBULATORY_CARE_PROVIDER_SITE_OTHER): Payer: Medicare Other | Admitting: Podiatry

## 2021-04-19 DIAGNOSIS — B351 Tinea unguium: Secondary | ICD-10-CM | POA: Diagnosis not present

## 2021-04-19 DIAGNOSIS — M79675 Pain in left toe(s): Secondary | ICD-10-CM | POA: Diagnosis not present

## 2021-04-19 DIAGNOSIS — L84 Corns and callosities: Secondary | ICD-10-CM

## 2021-04-19 DIAGNOSIS — M79674 Pain in right toe(s): Secondary | ICD-10-CM

## 2021-04-19 NOTE — Progress Notes (Signed)
Complaint:  Visit Type: Patient returns to my office for continued preventative foot care services.  Patient states" my nails have grown long and thick and become painful to walk and wear shoes".    The patient presents for preventative foot care services. She presents to the office with her caregiver.    Podiatric Exam: Vascular: dorsalis pedis and posterior tibial pulses are palpable bilateral. Capillary return is immediate. Temperature gradient is WNL. Skin turgor WNL  Sensorium: Normal Semmes Weinstein monofilament test. Normal tactile sensation bilaterally. Nail Exam: Pt has thick disfigured discolored nails with subungual debris noted bilateral entire nail hallux through fifth toenails Ulcer Exam: There is no evidence of ulcer or pre-ulcerative changes or infection. Orthopedic Exam: Muscle tone and strength are WNL. No limitations in general ROM. No crepitus or effusions noted. Foot type and digits show no abnormalities. Bony prominences are unremarkable. Pes planus. Skin: No Porokeratosis. No infection or ulcers.  Pinch callus right symptomatic.  Diagnosis:  Onychomycosis, , Pain in right toe, pain in left toes,  Pinch callus hallux B/L.  Treatment & Plan Procedures and Treatment: Consent by patient was obtained for treatment procedures.   Debridement of mycotic and hypertrophic toenails, 1 through 5 bilateral and clearing of subungual debris using nail nipper followed by dremel..Debride callus with # 15 blade.  Patient has callus lateral malleolus left ankle. No ulceration, no infection noted.  Return Visit-Office Procedure: Patient instructed to return to the office for a follow up visit 3 months for continued evaluation and treatment.    Gardiner Barefoot DPM

## 2021-05-09 ENCOUNTER — Other Ambulatory Visit: Payer: Self-pay | Admitting: Family Medicine

## 2021-05-09 DIAGNOSIS — F5105 Insomnia due to other mental disorder: Secondary | ICD-10-CM

## 2021-05-09 DIAGNOSIS — Z7289 Other problems related to lifestyle: Secondary | ICD-10-CM

## 2021-05-09 DIAGNOSIS — F99 Mental disorder, not otherwise specified: Secondary | ICD-10-CM

## 2021-05-26 ENCOUNTER — Ambulatory Visit
Admission: RE | Admit: 2021-05-26 | Discharge: 2021-05-26 | Disposition: A | Discharge status: Home / Self Care | Payer: Medicare Other | Source: Ambulatory Visit | Attending: Family Medicine | Admitting: Family Medicine

## 2021-05-26 DIAGNOSIS — Z0000 Encounter for general adult medical examination without abnormal findings: Secondary | ICD-10-CM

## 2021-05-27 ENCOUNTER — Other Ambulatory Visit: Payer: Self-pay

## 2021-05-27 DIAGNOSIS — E559 Vitamin D deficiency, unspecified: Secondary | ICD-10-CM

## 2021-05-27 MED ORDER — D3 SUPER STRENGTH 50 MCG (2000 UT) PO CAPS: 1.0000 | ORAL_CAPSULE | Freq: Every day | ORAL | 3 refills | Status: DC

## 2021-05-27 NOTE — Telephone Encounter (Signed)
Spoke with patients care giver who verbally understood bone density results and recommendations. They are needing refills on Vit.D and would like a prescription for calcium. Please advise.

## 2021-06-13 ENCOUNTER — Other Ambulatory Visit: Payer: Self-pay | Admitting: Family Medicine

## 2021-06-13 DIAGNOSIS — E559 Vitamin D deficiency, unspecified: Secondary | ICD-10-CM

## 2021-07-19 ENCOUNTER — Encounter: Payer: Self-pay | Admitting: Podiatry

## 2021-07-19 ENCOUNTER — Ambulatory Visit (INDEPENDENT_AMBULATORY_CARE_PROVIDER_SITE_OTHER): Payer: Medicare Other | Admitting: Podiatry

## 2021-07-19 ENCOUNTER — Other Ambulatory Visit: Payer: Self-pay

## 2021-07-19 DIAGNOSIS — B351 Tinea unguium: Secondary | ICD-10-CM | POA: Diagnosis not present

## 2021-07-19 DIAGNOSIS — M79675 Pain in left toe(s): Secondary | ICD-10-CM | POA: Diagnosis not present

## 2021-07-19 DIAGNOSIS — L84 Corns and callosities: Secondary | ICD-10-CM

## 2021-07-19 DIAGNOSIS — M79674 Pain in right toe(s): Secondary | ICD-10-CM

## 2021-07-19 NOTE — Progress Notes (Signed)
Complaint:  ?Visit Type: Patient returns to my office for continued preventative foot care services.  Patient states" my nails have grown long and thick and become painful to walk and wear shoes".    The patient presents for preventative foot care services. She presents to the office with her caregiver.   ? ?Podiatric Exam: ?Vascular: dorsalis pedis and posterior tibial pulses are palpable bilateral. Capillary return is immediate. Temperature gradient is WNL. Skin turgor WNL  ?Sensorium: Normal Semmes Weinstein monofilament test. Normal tactile sensation bilaterally. ?Nail Exam: Pt has thick disfigured discolored nails with subungual debris noted bilateral entire nail hallux through fifth toenails ?Ulcer Exam: There is no evidence of ulcer or pre-ulcerative changes or infection. ?Orthopedic Exam: Muscle tone and strength are WNL. No limitations in general ROM. No crepitus or effusions noted. Foot type and digits show no abnormalities. Bony prominences are unremarkable. Pes planus. ?Skin: No Porokeratosis. No infection or ulcers.  Pinch callus symptomatic. B/L. ? ?Diagnosis:  ?Onychomycosis, , Pain in right toe, pain in left toes,  Pinch callus hallux B/L. ? ?Treatment & Plan ?Procedures and Treatment: Consent by patient was obtained for treatment procedures.   Debridement of mycotic and hypertrophic toenails, 1 through 5 bilateral and clearing of subungual debris using nail nipper followed by dremel..Debride callus with # 15 blade.   No ulceration, no infection noted.  ?Return Visit-Office Procedure: Patient instructed to return to the office for a follow up visit 3 months for continued evaluation and treatment. ? ? ? ?Gardiner Barefoot DPM ?

## 2021-09-15 ENCOUNTER — Other Ambulatory Visit: Payer: Self-pay | Admitting: Family Medicine

## 2021-09-15 DIAGNOSIS — E039 Hypothyroidism, unspecified: Secondary | ICD-10-CM

## 2021-09-27 ENCOUNTER — Ambulatory Visit: Payer: Medicare Other | Admitting: Podiatry

## 2021-09-28 ENCOUNTER — Ambulatory Visit (INDEPENDENT_AMBULATORY_CARE_PROVIDER_SITE_OTHER): Payer: Medicare Other | Admitting: Podiatry

## 2021-09-28 ENCOUNTER — Encounter: Payer: Self-pay | Admitting: Podiatry

## 2021-09-28 DIAGNOSIS — L84 Corns and callosities: Secondary | ICD-10-CM | POA: Diagnosis not present

## 2021-09-28 DIAGNOSIS — B351 Tinea unguium: Secondary | ICD-10-CM | POA: Diagnosis not present

## 2021-09-28 DIAGNOSIS — M79675 Pain in left toe(s): Secondary | ICD-10-CM | POA: Diagnosis not present

## 2021-09-28 DIAGNOSIS — M79674 Pain in right toe(s): Secondary | ICD-10-CM

## 2021-09-28 NOTE — Progress Notes (Signed)
Complaint:  Visit Type: Patient returns to my office for continued preventative foot care services.  Patient states" my nails have grown long and thick and become painful to walk and wear shoes".    The patient presents for preventative foot care services. She presents to the office with her caregiver.    Podiatric Exam: Vascular: dorsalis pedis and posterior tibial pulses are palpable bilateral. Capillary return is immediate. Temperature gradient is WNL. Skin turgor WNL  Sensorium: Normal Semmes Weinstein monofilament test. Normal tactile sensation bilaterally. Nail Exam: Pt has thick disfigured discolored nails with subungual debris noted bilateral entire nail hallux through fifth toenails Ulcer Exam: There is no evidence of ulcer or pre-ulcerative changes or infection. Orthopedic Exam: Muscle tone and strength are WNL. No limitations in general ROM. No crepitus or effusions noted. Foot type and digits show no abnormalities. Bony prominences are unremarkable. Pes planus. Skin: No Porokeratosis. No infection or ulcers.  Pinch callus symptomatic. B/L.  Diagnosis:  Onychomycosis, , Pain in right toe, pain in left toes,  Pinch callus hallux B/L.  Treatment & Plan Procedures and Treatment: Consent by patient was obtained for treatment procedures.   Debridement of mycotic and hypertrophic toenails, 1 through 5 bilateral and clearing of subungual debris using nail nipper followed by dremel..Debride callus with # 15 blade.   No ulceration, no infection noted.  Return Visit-Office Procedure: Patient instructed to return to the office for a follow up visit 9 weeks for continued evaluation and treatment.    Gardiner Barefoot DPM

## 2021-12-09 ENCOUNTER — Ambulatory Visit (INDEPENDENT_AMBULATORY_CARE_PROVIDER_SITE_OTHER): Payer: Medicare Other | Admitting: Podiatry

## 2021-12-09 ENCOUNTER — Encounter: Payer: Self-pay | Admitting: Podiatry

## 2021-12-09 DIAGNOSIS — M79674 Pain in right toe(s): Secondary | ICD-10-CM

## 2021-12-09 DIAGNOSIS — M79675 Pain in left toe(s): Secondary | ICD-10-CM | POA: Diagnosis not present

## 2021-12-09 DIAGNOSIS — B351 Tinea unguium: Secondary | ICD-10-CM

## 2021-12-09 DIAGNOSIS — L84 Corns and callosities: Secondary | ICD-10-CM

## 2021-12-09 NOTE — Progress Notes (Signed)
Complaint:  Visit Type: Patient returns to my office for continued preventative foot care services.  Patient states" my nails have grown long and thick and become painful to walk and wear shoes".    The patient presents for preventative foot care services. She presents to the office with her caregiver.    Podiatric Exam: Vascular: dorsalis pedis and posterior tibial pulses are palpable bilateral. Capillary return is immediate. Temperature gradient is WNL. Skin turgor WNL  Sensorium: Normal Semmes Weinstein monofilament test. Normal tactile sensation bilaterally. Nail Exam: Pt has thick disfigured discolored nails with subungual debris noted bilateral entire nail hallux through fifth toenails Ulcer Exam: There is no evidence of ulcer or pre-ulcerative changes or infection. Orthopedic Exam: Muscle tone and strength are WNL. No limitations in general ROM. No crepitus or effusions noted. Foot type and digits show no abnormalities. Bony prominences are unremarkable. Pes planus. Skin: No Porokeratosis. No infection or ulcers.  Pinch callus symptomatic. B/L.  Diagnosis:  Onychomycosis, , Pain in right toe, pain in left toes,  Pinch callus hallux B/L.  Treatment & Plan Procedures and Treatment: Consent by patient was obtained for treatment procedures.   Debridement of mycotic and hypertrophic toenails, 1 through 5 bilateral and clearing of subungual debris using nail nipper followed by dremel..Debride callus with # 15 blade and dremel tool. No ulceration, no infection noted.  Return Visit-Office Procedure: Patient instructed to return to the office for a follow up visit  10 weeks for continued evaluation and treatment.    Niyanna Asch DPM 

## 2022-02-09 ENCOUNTER — Ambulatory Visit (INDEPENDENT_AMBULATORY_CARE_PROVIDER_SITE_OTHER): Payer: Medicare Other | Admitting: Family Medicine

## 2022-02-09 ENCOUNTER — Ambulatory Visit (INDEPENDENT_AMBULATORY_CARE_PROVIDER_SITE_OTHER): Payer: Medicare Other

## 2022-02-09 ENCOUNTER — Encounter: Payer: Self-pay | Admitting: Family Medicine

## 2022-02-09 VITALS — BP 118/74 | HR 71 | Temp 97.7°F | Ht 62.0 in | Wt 169.8 lb

## 2022-02-09 DIAGNOSIS — R0602 Shortness of breath: Secondary | ICD-10-CM

## 2022-02-09 DIAGNOSIS — E559 Vitamin D deficiency, unspecified: Secondary | ICD-10-CM

## 2022-02-09 DIAGNOSIS — Z23 Encounter for immunization: Secondary | ICD-10-CM | POA: Insufficient documentation

## 2022-02-09 DIAGNOSIS — F79 Unspecified intellectual disabilities: Secondary | ICD-10-CM | POA: Diagnosis not present

## 2022-02-09 DIAGNOSIS — G4762 Sleep related leg cramps: Secondary | ICD-10-CM | POA: Diagnosis not present

## 2022-02-09 DIAGNOSIS — E039 Hypothyroidism, unspecified: Secondary | ICD-10-CM

## 2022-02-09 DIAGNOSIS — Z0000 Encounter for general adult medical examination without abnormal findings: Secondary | ICD-10-CM

## 2022-02-09 DIAGNOSIS — M545 Low back pain, unspecified: Secondary | ICD-10-CM

## 2022-02-09 DIAGNOSIS — R0609 Other forms of dyspnea: Secondary | ICD-10-CM | POA: Insufficient documentation

## 2022-02-09 DIAGNOSIS — R4689 Other symptoms and signs involving appearance and behavior: Secondary | ICD-10-CM

## 2022-02-09 MED ORDER — GABAPENTIN 100 MG PO CAPS: 100.0000 mg | ORAL_CAPSULE | Freq: Every day | ORAL | 3 refills | Status: DC

## 2022-02-09 NOTE — Progress Notes (Unsigned)
Initial visit for LBP, denies injury Pt is mentally challenged. She would panic during the exam and did not understand some directions. Best possible images

## 2022-02-09 NOTE — Progress Notes (Unsigned)
Pts initial visit for SOB. Never a smoker. Pt is mentally challenged. She panicked during exam.  Best possible images

## 2022-02-09 NOTE — Progress Notes (Signed)
Established Patient Office Visit  Subjective   Patient ID: Abigail Webster, female    DOB: 05/16/1960  Age: 61 y.o. MRN: [id number]  Chief Complaint  Patient presents with   Leg Pain    Leg cramps x few months becoming worse. Still having back pains x few years, seems to be SOB pretty frequently.     Leg Pain  Pertinent negatives include no tingling.   fourth follow-up of hypothyroidism and behavior difficulties.  TSH well-adjusted on current dose of levothyroxine.  Seroquel is made a big difference in her behavior.  Abigail Webster has been having cramping in her legs at nighttime.  Abigail Webster reports nonradiating lower back pain.  Denies weakness.  Denies difficulty with stooling or urination.  Denies vaginal bleeding or hematuria.  Abigail Webster also complains of shortness of breath.  There is no chest pain or diaphoresis.  Abigail Webster has no history of asthma or tobacco use.  Abigail Webster has a favorable lipid profile with HDL of 74.  Abigail Webster does not have hypertension.    Review of Systems  Constitutional: Negative.  Negative for chills, diaphoresis and fever.  HENT: Negative.    Eyes:  Positive for blurred vision. Negative for discharge and redness.  Respiratory:  Positive for shortness of breath. Negative for cough, hemoptysis, sputum production and wheezing.   Cardiovascular:  Positive for chest pain.  Gastrointestinal:  Negative for abdominal pain, blood in stool, constipation, nausea and vomiting.  Genitourinary: Negative.  Negative for frequency and hematuria.  Musculoskeletal:  Positive for back pain and myalgias.  Skin:  Negative for rash.  Neurological:  Negative for tingling, loss of consciousness and weakness.  Endo/Heme/Allergies:  Negative for polydipsia.      Objective:     BP 118/74 (BP Location: Right Arm, Patient Position: Sitting, Cuff Size: Normal)   Pulse 71   Temp 97.7 F (36.5 C) (Temporal)   Ht '5\' 2"'$  (1.575 m)   Wt 169 lb 12.8 oz (77 kg)   SpO2 96%   BMI 31.06 kg/m    Physical  Exam Constitutional:      General: Abigail Webster is not in acute distress.    Appearance: Normal appearance. Abigail Webster is not ill-appearing, toxic-appearing or diaphoretic.  HENT:     Head: Normocephalic and atraumatic.     Right Ear: External ear normal.     Left Ear: External ear normal.  Eyes:     General: No scleral icterus.       Right eye: No discharge.        Left eye: No discharge.     Extraocular Movements: Extraocular movements intact.     Conjunctiva/sclera: Conjunctivae normal.  Cardiovascular:     Rate and Rhythm: Normal rate and regular rhythm.  Pulmonary:     Effort: Pulmonary effort is normal. No respiratory distress.     Breath sounds: Normal breath sounds. No wheezing or rales.  Abdominal:     General: Bowel sounds are normal.  Musculoskeletal:     Cervical back: No rigidity or tenderness.     Lumbar back: Tenderness and bony tenderness (Mild tenderness along the lumbar vertebrae.) present. No spasms. Normal range of motion.  Skin:    General: Skin is warm and dry.  Neurological:     Mental Status: Abigail Webster is alert and oriented to person, place, and time.     Motor: No weakness.  Psychiatric:        Mood and Affect: Mood normal.        Behavior: Behavior  normal.      No results found for any visits on 02/09/22.    The ASCVD Risk score (Arnett DK, et al., 2019) failed to calculate for the following reasons:   Cannot find a previous HDL lab   Cannot find a previous total cholesterol lab    Assessment & Plan:   Problem List Items Addressed This Visit       Endocrine   Acquired hypothyroidism - Primary   Relevant Orders   TSH     Other   Mentally challenged   Healthcare maintenance   Relevant Orders   CBC   Comprehensive metabolic panel   LDL cholesterol, direct   Vitamin D deficiency   Relevant Orders   VITAMIN D 25 Hydroxy (Vit-D Deficiency, Fractures)   Nocturnal leg cramps   Relevant Medications   gabapentin (NEURONTIN) 100 MG capsule   Other Relevant  Orders   Magnesium   Midline low back pain without sciatica   Relevant Orders   DG Lumbar Spine Complete   Ambulatory referral to Home Health   SOB (shortness of breath)   Relevant Orders   DG Chest 2 View    Return in about 3 months (around 05/12/2022), or if symptoms worsen or fail to improve.  Checking lumbar and chest films.  Have asked for physical therapy.  Believe that her lower back pain could be from her posture.   [name], MD

## 2022-02-10 LAB — COMPREHENSIVE METABOLIC PANEL
ALT: 15 U/L (ref 0–35)
AST: 18 U/L (ref 0–37)
Albumin: 4.1 g/dL (ref 3.5–5.2)
Alkaline Phosphatase: 90 U/L (ref 39–117)
BUN: 18 mg/dL (ref 6–23)
CO2: 28 mEq/L (ref 19–32)
Calcium: 9.2 mg/dL (ref 8.4–10.5)
Chloride: 104 mEq/L (ref 96–112)
Creatinine, Ser: 1.19 mg/dL (ref 0.40–1.20)
GFR: 49.32 mL/min — ABNORMAL LOW (ref >60.00)
Glucose, Bld: 98 mg/dL (ref 70–99)
Potassium: 3.9 mEq/L (ref 3.5–5.1)
Sodium: 141 mEq/L (ref 135–145)
Total Bilirubin: 0.6 mg/dL (ref 0.2–1.2)
Total Protein: 7 g/dL (ref 6.0–8.3)

## 2022-02-10 LAB — TSH: TSH: 1.66 u[IU]/mL (ref 0.35–5.50)

## 2022-02-10 LAB — LDL CHOLESTEROL, DIRECT: Direct LDL: 80 mg/dL

## 2022-02-10 LAB — CBC
HCT: 37.7 % (ref 36.0–46.0)
Hemoglobin: 12.3 g/dL (ref 12.0–15.0)
MCHC: 32.7 g/dL (ref 30.0–36.0)
MCV: 91.7 fl (ref 78.0–100.0)
Platelets: 234 10*3/uL (ref 150.0–400.0)
RBC: 4.11 Mil/uL (ref 3.87–5.11)
RDW: 14.3 % (ref 11.5–15.5)
WBC: 5.3 10*3/uL (ref 4.0–10.5)

## 2022-02-10 LAB — MAGNESIUM: Magnesium: 1.7 mg/dL (ref 1.5–2.5)

## 2022-02-10 LAB — VITAMIN D 25 HYDROXY (VIT D DEFICIENCY, FRACTURES): VITD: 60.03 ng/mL (ref 30.00–100.00)

## 2022-02-20 ENCOUNTER — Ambulatory Visit (INDEPENDENT_AMBULATORY_CARE_PROVIDER_SITE_OTHER): Payer: Medicare Other | Admitting: Family Medicine

## 2022-02-20 ENCOUNTER — Encounter: Payer: Self-pay | Admitting: Family Medicine

## 2022-02-20 VITALS — BP 138/78 | HR 66 | Temp 97.4°F | Ht 62.0 in | Wt 170.0 lb

## 2022-02-20 DIAGNOSIS — T24012A Burn of unspecified degree of left thigh, initial encounter: Secondary | ICD-10-CM | POA: Diagnosis not present

## 2022-02-20 DIAGNOSIS — T300 Burn of unspecified body region, unspecified degree: Secondary | ICD-10-CM

## 2022-02-20 DIAGNOSIS — T310 Burns involving less than 10% of body surface: Secondary | ICD-10-CM | POA: Diagnosis not present

## 2022-02-20 MED ORDER — SILVER SULFADIAZINE 1 % EX CREA: 1.0000 | TOPICAL_CREAM | Freq: Every day | CUTANEOUS | 1 refills | Status: DC
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
## 2022-02-24 ENCOUNTER — Other Ambulatory Visit: Payer: Self-pay | Admitting: Family Medicine

## 2022-02-24 ENCOUNTER — Telehealth: Payer: Self-pay | Admitting: Family Medicine

## 2022-02-24 DIAGNOSIS — Z1231 Encounter for screening mammogram for malignant neoplasm of breast: Secondary | ICD-10-CM
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
## 2022-02-24 NOTE — Telephone Encounter (Signed)
She called back, left [phone]

## 2022-02-24 NOTE — Telephone Encounter (Signed)
Verbal given, written order will be faxed over

## 2022-02-27 ENCOUNTER — Encounter: Payer: Self-pay | Admitting: Family Medicine

## 2022-02-27 ENCOUNTER — Ambulatory Visit (INDEPENDENT_AMBULATORY_CARE_PROVIDER_SITE_OTHER): Payer: Medicare Other | Admitting: Family Medicine

## 2022-02-27 VITALS — BP 110/64 | HR 60 | Temp 97.0°F | Resp 16 | Wt 170.0 lb

## 2022-02-27 DIAGNOSIS — T310 Burns involving less than 10% of body surface: Secondary | ICD-10-CM

## 2022-02-27 DIAGNOSIS — T24012A Burn of unspecified degree of left thigh, initial encounter: Secondary | ICD-10-CM

## 2022-02-27 DIAGNOSIS — T300 Burn of unspecified body region, unspecified degree: Secondary | ICD-10-CM

## 2022-02-27 MED ORDER — SILVER SULFADIAZINE 1 % EX CREA: 1.0000 | TOPICAL_CREAM | Freq: Every day | CUTANEOUS | 1 refills | Status: DC
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
## 2022-02-28 ENCOUNTER — Telehealth: Payer: Self-pay | Admitting: Family Medicine
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
## 2022-02-28 NOTE — Telephone Encounter (Signed)
Verbal orders given written orders will be faxed.

## 2022-03-06 ENCOUNTER — Encounter: Payer: Self-pay | Admitting: Family Medicine

## 2022-03-06 ENCOUNTER — Ambulatory Visit (INDEPENDENT_AMBULATORY_CARE_PROVIDER_SITE_OTHER): Payer: Medicare Other | Admitting: Family Medicine

## 2022-03-06 VITALS — BP 146/73 | HR 67 | Temp 97.6°F | Ht 62.0 in | Wt 170.4 lb

## 2022-03-06 DIAGNOSIS — T24012A Burn of unspecified degree of left thigh, initial encounter: Secondary | ICD-10-CM

## 2022-03-06 DIAGNOSIS — T300 Burn of unspecified body region, unspecified degree: Secondary | ICD-10-CM
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
## 2022-03-10 ENCOUNTER — Encounter: Payer: Self-pay | Admitting: Podiatry

## 2022-03-10 ENCOUNTER — Ambulatory Visit (INDEPENDENT_AMBULATORY_CARE_PROVIDER_SITE_OTHER): Payer: Medicare Other | Admitting: Podiatry

## 2022-03-10 DIAGNOSIS — M79674 Pain in right toe(s): Secondary | ICD-10-CM

## 2022-03-10 DIAGNOSIS — B351 Tinea unguium: Secondary | ICD-10-CM | POA: Diagnosis not present

## 2022-03-10 DIAGNOSIS — M79675 Pain in left toe(s): Secondary | ICD-10-CM

## 2022-03-10 DIAGNOSIS — L84 Corns and callosities: Secondary | ICD-10-CM | POA: Diagnosis not present

## 2022-03-10 NOTE — Progress Notes (Signed)
Complaint:  Visit Type: Patient returns to my office for continued preventative foot care services.  Patient states" my nails have grown long and thick and become painful to walk and wear shoes".    The patient presents for preventative foot care services. She presents to the office with her caregiver.    Podiatric Exam: Vascular: dorsalis pedis and posterior tibial pulses are palpable bilateral. Capillary return is immediate. Temperature gradient is WNL. Skin turgor WNL  Sensorium: Normal Semmes Weinstein monofilament test. Normal tactile sensation bilaterally. Nail Exam: Pt has thick disfigured discolored nails with subungual debris noted bilateral entire nail hallux through fifth toenails Ulcer Exam: There is no evidence of ulcer or pre-ulcerative changes or infection. Orthopedic Exam: Muscle tone and strength are WNL. No limitations in general ROM. No crepitus or effusions noted. Foot type and digits show no abnormalities. Bony prominences are unremarkable. Pes planus. Skin: No Porokeratosis. No infection or ulcers.  Pinch callus symptomatic. B/L.  Diagnosis:  Onychomycosis, , Pain in right toe, pain in left toes,  Pinch callus hallux B/L.  Treatment & Plan Procedures and Treatment: Consent by patient was obtained for treatment procedures.   Debridement of mycotic and hypertrophic toenails, 1 through 5 bilateral and clearing of subungual debris using nail nipper followed by dremel..Debride callus with # 15 blade and dremel tool. No ulceration, no infection noted.  Return Visit-Office Procedure: Patient instructed to return to the office for a follow up visit  10 weeks for continued evaluation and treatment.    Giulietta Prokop DPM 

## 2022-03-13 ENCOUNTER — Ambulatory Visit (INDEPENDENT_AMBULATORY_CARE_PROVIDER_SITE_OTHER): Payer: Medicare Other | Admitting: Family Medicine

## 2022-03-13 ENCOUNTER — Encounter: Payer: Self-pay | Admitting: Family Medicine

## 2022-03-13 ENCOUNTER — Ambulatory Visit (INDEPENDENT_AMBULATORY_CARE_PROVIDER_SITE_OTHER): Payer: Medicare Other

## 2022-03-13 VITALS — Ht 62.0 in | Wt 170.0 lb

## 2022-03-13 VITALS — BP 124/74 | HR 61 | Temp 97.0°F | Ht 62.0 in | Wt 170.2 lb

## 2022-03-13 DIAGNOSIS — T24012D Burn of unspecified degree of left thigh, subsequent encounter: Secondary | ICD-10-CM

## 2022-03-13 DIAGNOSIS — Z0000 Encounter for general adult medical examination without abnormal findings: Secondary | ICD-10-CM

## 2022-03-13 DIAGNOSIS — T300 Burn of unspecified body region, unspecified degree: Secondary | ICD-10-CM

## 2022-03-13 MED ORDER — MEDERMA ADVANCED SCAR GEL EX GEL: CUTANEOUS | 1 refills | Status: AC

## 2022-03-13 NOTE — Patient Instructions (Signed)
Abigail Webster , Thank you for taking time to come for your Medicare Wellness Visit. I appreciate your ongoing commitment to your health goals. Please review the following plan we discussed and let me know if I can assist you in the future.   Screening recommendations/referrals: Colonoscopy: completed 05/01/2013, due 05/02/2023 Mammogram: scheduled for 04/21/2022 Bone Density: n/a Recommended yearly ophthalmology/optometry visit for glaucoma screening and checkup Recommended yearly dental visit for hygiene and checkup  Vaccinations: Influenza vaccine: completed 02/08/2022 Pneumococcal vaccine: n/a Tdap vaccine: completed 05/16/2019, due 05/15/2029 Shingles vaccine: n/a  Covid-19:  03/01/2020, 08/14/2019, 07/24/2019  Advanced directives: patient has legal guardian  Conditions/risks identified: none  Next appointment: Follow up in one year for your annual wellness visit.   Preventive Care 40-64 Years, Female Preventive care refers to lifestyle choices and visits with your health care provider that can promote health and wellness. What does preventive care include? A yearly physical exam. This is also called an annual well check. Dental exams once or twice a year. Routine eye exams. Ask your health care provider how often you should have your eyes checked. Personal lifestyle choices, including: Daily care of your teeth and gums. Regular physical activity. Eating a healthy diet. Avoiding tobacco and drug use. Limiting alcohol use. Practicing safe sex. Taking low-dose aspirin daily starting at age 3. Taking vitamin and mineral supplements as recommended by your health care provider. What happens during an annual well check? The services and screenings done by your health care provider during your annual well check will depend on your age, overall health, lifestyle risk factors, and family history of disease. Counseling  Your health care provider may ask you questions about your: Alcohol  use. Tobacco use. Drug use. Emotional well-being. Home and relationship well-being. Sexual activity. Eating habits. Work and work Statistician. Method of birth control. Menstrual cycle. Pregnancy history. Screening  You may have the following tests or measurements: Height, weight, and BMI. Blood pressure. Lipid and cholesterol levels. These may be checked every 5 years, or more frequently if you are over 68 years old. Skin check. Lung cancer screening. You may have this screening every year starting at age 68 if you have a 30-pack-year history of smoking and currently smoke or have quit within the past 15 years. Fecal occult blood test (FOBT) of the stool. You may have this test every year starting at age 44. Flexible sigmoidoscopy or colonoscopy. You may have a sigmoidoscopy every 5 years or a colonoscopy every 10 years starting at age 73. Hepatitis C blood test. Hepatitis B blood test. Sexually transmitted disease (STD) testing. Diabetes screening. This is done by checking your blood sugar (glucose) after you have not eaten for a while (fasting). You may have this done every 1-3 years. Mammogram. This may be done every 1-2 years. Talk to your health care provider about when you should start having regular mammograms. This may depend on whether you have a family history of breast cancer. BRCA-related cancer screening. This may be done if you have a family history of breast, ovarian, tubal, or peritoneal cancers. Pelvic exam and Pap test. This may be done every 3 years starting at age 41. Starting at age 78, this may be done every 5 years if you have a Pap test in combination with an HPV test. Bone density scan. This is done to screen for osteoporosis. You may have this scan if you are at high risk for osteoporosis. Discuss your test results, treatment options, and if necessary, the need for  more tests with your health care provider. Vaccines  Your health care provider may recommend  certain vaccines, such as: Influenza vaccine. This is recommended every year. Tetanus, diphtheria, and acellular pertussis (Tdap, Td) vaccine. You may need a Td booster every 10 years. Zoster vaccine. You may need this after age 79. Pneumococcal 13-valent conjugate (PCV13) vaccine. You may need this if you have certain conditions and were not previously vaccinated. Pneumococcal polysaccharide (PPSV23) vaccine. You may need one or two doses if you smoke cigarettes or if you have certain conditions. Talk to your health care provider about which screenings and vaccines you need and how often you need them. This information is not intended to replace advice given to you by your health care provider. Make sure you discuss any questions you have with your health care provider. Document Released: 05/14/2015 Document Revised: 01/05/2016 Document Reviewed: 02/16/2015 Elsevier Interactive Patient Education  2017 [employer] Prevention in the Home Falls can cause injuries. They can happen to people of all ages. There are many things you can do to make your home safe and to help prevent falls. What can I do on the outside of my home? Regularly fix the edges of walkways and driveways and fix any cracks. Remove anything that might make you trip as you walk through a door, such as a raised step or threshold. Trim any bushes or trees on the path to your home. Use bright outdoor lighting. Clear any walking paths of anything that might make someone trip, such as rocks or tools. Regularly check to see if handrails are loose or broken. Make sure that both sides of any steps have handrails. Any raised decks and porches should have guardrails on the edges. Have any leaves, snow, or ice cleared regularly. Use sand or salt on walking paths during winter. Clean up any spills in your garage right away. This includes oil or grease spills. What can I do in the bathroom? Use night lights. Install grab bars  by the toilet and in the tub and shower. Do not use towel bars as grab bars. Use non-skid mats or decals in the tub or shower. If you need to sit down in the shower, use a plastic, non-slip stool. Keep the floor dry. Clean up any water that spills on the floor as soon as it happens. Remove soap buildup in the tub or shower regularly. Attach bath mats securely with double-sided non-slip rug tape. Do not have throw rugs and other things on the floor that can make you trip. What can I do in the bedroom? Use night lights. Make sure that you have a light by your bed that is easy to reach. Do not use any sheets or blankets that are too big for your bed. They should not hang down onto the floor. Have a firm chair that has side arms. You can use this for support while you get dressed. Do not have throw rugs and other things on the floor that can make you trip. What can I do in the kitchen? Clean up any spills right away. Avoid walking on wet floors. Keep items that you use a lot in easy-to-reach places. If you need to reach something above you, use a strong step stool that has a grab bar. Keep electrical cords out of the way. Do not use floor polish or wax that makes floors slippery. If you must use wax, use non-skid floor wax. Do not have throw rugs and other things  on the floor that can make you trip. What can I do with my stairs? Do not leave any items on the stairs. Make sure that there are handrails on both sides of the stairs and use them. Fix handrails that are broken or loose. Make sure that handrails are as long as the stairways. Check any carpeting to make sure that it is firmly attached to the stairs. Fix any carpet that is loose or worn. Avoid having throw rugs at the top or bottom of the stairs. If you do have throw rugs, attach them to the floor with carpet tape. Make sure that you have a light switch at the top of the stairs and the bottom of the stairs. If you do not have them, ask  someone to add them for you. What else can I do to help prevent falls? Wear shoes that: Do not have high heels. Have rubber bottoms. Are comfortable and fit you well. Are closed at the toe. Do not wear sandals. If you use a stepladder: Make sure that it is fully opened. Do not climb a closed stepladder. Make sure that both sides of the stepladder are locked into place. Ask someone to hold it for you, if possible. Clearly mark and make sure that you can see: Any grab bars or handrails. First and last steps. Where the edge of each step is. Use tools that help you move around (mobility aids) if they are needed. These include: Canes. Walkers. Scooters. Crutches. Turn on the lights when you go into a dark area. Replace any light bulbs as soon as they burn out. Set up your furniture so you have a clear path. Avoid moving your furniture around. If any of your floors are uneven, fix them. If there are any pets around you, be aware of where they are. Review your medicines with your doctor. Some medicines can make you feel dizzy. This can increase your chance of falling. Ask your doctor what other things that you can do to help prevent falls. This information is not intended to replace advice given to you by your health care provider. Make sure you discuss any questions you have with your health care provider. Document Released: 02/11/2009 Document Revised: 09/23/2015 Document Reviewed: 05/22/2014 Elsevier Interactive Patient Education  2017 [employer].
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
## 2022-03-13 NOTE — Progress Notes (Signed)
I connected with Abigail Webster today by telephone and verified that I am speaking with the correct person using two identifiers. Location patient: home Location provider: work Persons participating in the virtual visit: Abigail Webster, Abigail Webster (caregiver), Glenna Durand LPN.   I discussed the limitations, risks, security and privacy concerns of performing an evaluation and management service by telephone and the availability of in person appointments. I also discussed with the patient that there may be a patient responsible charge related to this service. The patient expressed understanding and verbally consented to this telephonic visit.    Interactive audio and video telecommunications were attempted between this provider and patient, however failed, due to patient having technical difficulties OR patient did not have access to video capability.  We continued and completed visit with audio only.     Vital signs may be patient reported or missing.  Subjective:   Abigail Webster is a 61 y.o. female who presents for Medicare Annual (Subsequent) preventive examination.  Review of Systems     Cardiac Risk Factors include: obesity (BMI >30kg/m2)     Objective:    Today's Vitals   03/13/22 0813  Weight: 170 lb (77.1 kg)  Height: '5\' 2"'$  (1.575 m)   Body mass index is 31.09 kg/m.     03/13/2022    8:18 AM 03/09/2021    3:15 PM 10/14/2020    8:41 AM 11/13/2019   11:23 AM  Advanced Directives  Does Patient Have a Medical Advance Directive? Yes No No Yes  Type of Paramedic of [place];Living will  Copy of [place] in Chart?    No - copy requested  Would patient like information on creating a medical advance directive?  No - Patient declined      Current Medications (verified) Outpatient Encounter Medications as of 03/13/2022  Medication Sig   D3 SUPER STRENGTH 50 MCG (2000 UT) CAPS TAKE 1 CAPSULE BY MOUTH  EVERY DAY   Emollient (EUCERIN EX) Apply 1 application topically daily.   gabapentin (NEURONTIN) 100 MG capsule Take 1 capsule (100 mg total) by mouth at bedtime. As needed for leg cramps   levothyroxine (SYNTHROID) 50 MCG tablet TAKE 1 TABLET BY MOUTH EVERY MORNING ON A fasting stomach 1 hour BEFORE eating   MYRBETRIQ 50 MG TB24 tablet Take 50 mg by mouth daily.   QUEtiapine (SEROQUEL) 100 MG tablet TAKE 1 TABLET BY MOUTH AT BEDTIME   REFRESH TEARS 0.5 % SOLN Place 1 drop into both eyes daily.   RESTASIS 0.05 % ophthalmic emulsion Place 1 drop into both eyes 2 (two) times daily.   silver sulfADIAZINE (SILVADENE) 1 % cream Apply 1 Application topically daily.   No facility-administered encounter medications on file as of 03/13/2022.    Allergies (verified) Patient has no known allergies.   History: Past Medical History:  Diagnosis Date   Hypothyroidism    Thyroid disease    Past Surgical History:  Procedure Laterality Date   FOOT SURGERY     TRANSURETHRAL RESECTION OF BLADDER TUMOR WITH MITOMYCIN-C N/A 10/18/2020   Procedure: TRANSURETHRAL RESECTION OF BLADDER TUMOR WITH GEMCITABINE;  Surgeon: Lucas Mallow, MD;  Location: [facility];  Service: Urology;  Laterality: N/A;   Family History  Problem Relation Age of Onset   Pancreatic cancer Mother    Hypertension Mother    Hypothyroidism Mother    Colon cancer Father    Hypertension Father  Social History   Socioeconomic History   Marital status: Single    Spouse name: Not on file   Number of children: Not on file   Years of education: Not on file   Highest education level: Not on file  Occupational History   Not on file  Tobacco Use   Smoking status: Never   Smokeless tobacco: Never  Vaping Use   Vaping Use: Never used  Substance and Sexual Activity   Alcohol use: Never   Drug use: Never   Sexual activity: Never  Other Topics Concern   Not on file  Social History Narrative   Not on file   Social  Determinants of Health   Financial Resource Strain: Low Risk  (03/13/2022)   Overall Financial Resource Strain (CARDIA)    Difficulty of Paying Living Expenses: Not hard at all  Food Insecurity: No Food Insecurity (03/13/2022)   Hunger Vital Sign    Worried About Running Out of Food in the Last Year: Never true    [place] in the Last Year: Never true  Transportation Needs: No Transportation Needs (03/13/2022)   PRAPARE - Hydrologist (Medical): No    Lack of Transportation (Non-Medical): No  Physical Activity: Insufficiently Active (03/13/2022)   Exercise Vital Sign    Days of Exercise per Week: 2 days    Minutes of Exercise per Session: 30 min  Stress: No Stress Concern Present (03/13/2022)   [place]    Feeling of Stress : Not at all  Social Connections: Moderately Integrated (03/09/2021)   Social Connection and Isolation Panel [NHANES]    Frequency of Communication with Friends and Family: Twice a week    Frequency of Social Gatherings with Friends and Family: Twice a week    Attends Religious Services: More than 4 times per year    Active Member of [employer] or Organizations: Yes    Attends Music therapist: More than 4 times per year    Marital Status: Never married    Tobacco Counseling Counseling given: Not Answered   Clinical Intake:  Pre-visit preparation completed: Yes  Pain : No/denies pain     Nutritional Status: BMI > 30  Obese Nutritional Risks: None Diabetes: No  How often do you need to have someone help you when you read instructions, pamphlets, or other written materials from your doctor or pharmacy?: 5 - Always  Diabetic? no  Interpreter Needed?: No  Information entered by :: NAllen LPN   Activities of Daily Living    03/13/2022    8:19 AM  In your present state of health, do you have any difficulty performing the following  activities:  Hearing? 0  Vision? 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 1  Walking or climbing stairs? 1  Dressing or bathing? 1  Doing errands, shopping? 1  Using the Toilet? N  In the past six months, have you accidently leaked urine? Y  Do you have problems with loss of bowel control? N  Managing your Medications? Y  Managing your Finances? Y  Housekeeping or managing your Housekeeping? Y    Patient Care Team: [name], MD as PCP - General (Family Medicine)  Indicate any recent Medical Services you may have received from other than [facility] providers in the past year (date may be approximate).     Assessment:   This is a routine  wellness examination for [name].  Hearing/Vision screen Vision Screening - Comments:: Regular eye exams, [place]  Dietary issues and exercise activities discussed: Current Exercise Habits: Home exercise routine (PT), Type of exercise: stretching;strength training/weights, Time (Minutes): 30, Frequency (Times/Week): 2, Weekly Exercise (Minutes/Week): 60, Intensity: Mild   Goals Addressed             This Visit's Progress    Patient Stated       03/13/2022, no goals       Depression Screen    03/13/2022    8:19 AM 03/06/2022    1:10 PM 02/09/2022    1:46 PM 03/09/2021    3:14 PM 02/07/2021   10:38 AM 11/29/2020   10:00 AM 11/15/2020   11:20 AM  PHQ 2/9 Scores  PHQ - 2 Score 0 0 0 0 0 0 0    Fall Risk    03/13/2022    8:19 AM 03/06/2022    1:09 PM 02/27/2022   10:57 AM 02/09/2022    1:46 PM 03/09/2021    3:16 PM  Fall Risk   Falls in the past year? 0 0 0 0 0  Number falls in past yr: 0 0 0 0 0  Injury with Fall? 0 0 0  0  Risk for fall due to : Medication side effect;Impaired vision      Follow up Falls prevention discussed;Falls evaluation completed;Education provided    Falls evaluation completed    FALL RISK PREVENTION PERTAINING TO THE HOME:  Any stairs in or around  the home? No  If so, are there any without handrails? N/a Home free of loose throw rugs in walkways, pet beds, electrical cords, etc? Yes  Adequate lighting in your home to reduce risk of falls? Yes   ASSISTIVE DEVICES UTILIZED TO PREVENT FALLS:  Life alert? No  Use of a cane, walker or w/c? No  Grab bars in the bathroom? No  Shower chair or bench in shower? No  Elevated toilet seat or a handicapped toilet? Yes   TIMED UP AND GO:  Was the test performed? No .       Cognitive Function:  6 CIT not administered. 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        Immunizations Immunization History  Administered Date(s) Administered   Influenza Whole 02/08/2022   Influenza,inj,Quad PF,6+ Mos 05/17/2017, 02/12/2018, 01/08/2019, 02/13/2020, 02/07/2021   PFIZER(Purple Top)SARS-COV-2 Vaccination 07/24/2019, 08/14/2019, 03/01/2020   Tdap 05/16/2019    TDAP status: Up to date  Flu Vaccine status: Up to date  Pneumococcal vaccine status: Up to date  Covid-19 vaccine status: Completed vaccines  Qualifies for Shingles Vaccine? Yes   Zostavax completed No   Shingrix Completed?: No.    Education has been provided regarding the importance of this vaccine. Patient has been advised to call insurance company to determine out of pocket expense if they have not yet received this vaccine. Advised may also receive vaccine at local pharmacy or Health Dept. Verbalized acceptance and understanding.  Screening Tests Health Maintenance  Topic Date Due   PAP SMEAR-Modifier  Never done   MAMMOGRAM  01/24/2022   Medicare Annual Wellness (AWV)  03/09/2022   COLONOSCOPY (Pts 45-58yr Insurance coverage will need to be confirmed)  05/02/2023   TETANUS/TDAP  05/15/2029   INFLUENZA VACCINE  Completed   Hepatitis C Screening  Completed   HPV VACCINES  Aged Out   COVID-19 Vaccine  Discontinued   HIV Screening  Discontinued  Zoster Vaccines- Shingrix  Discontinued     Health Maintenance  Health Maintenance Due  Topic Date Due   PAP SMEAR-Modifier  Never done   MAMMOGRAM  01/24/2022   Medicare Annual Wellness (AWV)  03/09/2022    Colorectal cancer screening: Type of screening: Colonoscopy. Completed 05/01/2013. Repeat every 10 years  Mammogram status: scheduled for 04/21/2022  Bone Density status: n/a  Lung Cancer Screening: (Low Dose CT Chest recommended if Age 58-80 years, 30 pack-year currently smoking OR have quit w/in 15years.) does not qualify.   Lung Cancer Screening Referral: no  Additional Screening:  Hepatitis C Screening: does qualify; Completed 05/16/2019  Vision Screening: Recommended annual ophthalmology exams for early detection of glaucoma and other disorders of the eye. Is the patient up to date with their annual eye exam?  Yes  Who is the provider or what is the name of the office in which the patient attends annual eye exams? [facility] If pt is not established with a provider, would they like to be referred to a provider to establish care? No .   Dental Screening: Recommended annual dental exams for proper oral hygiene  Community Resource Referral / Chronic Care Management: CRR required this visit?  No   CCM required this visit?  No      Plan:     I have personally reviewed and noted the following in the patient's chart:   Medical and social history Use of alcohol, tobacco or illicit drugs  Current medications and supplements including opioid prescriptions. Patient is not currently taking opioid prescriptions. Functional ability and status Nutritional status Physical activity Advanced directives List of other physicians Hospitalizations, surgeries, and ER visits in previous 12 months Vitals Screenings to include cognitive, depression, and falls Referrals and appointments  In addition, I have reviewed and discussed with patient certain preventive protocols, quality metrics, and best practice  recommendations. A written personalized care plan for preventive services as well as general preventive health recommendations were provided to patient.     Kellie Simmering, LPN   [id number]   Nurse Notes: none  Due to this being a virtual visit, the after visit summary with patients personalized plan was offered to patient via mail or my-chart.  Patient would like to access on my-chart

## 2022-03-15 ENCOUNTER — Telehealth: Payer: Self-pay | Admitting: Family Medicine

## 2022-03-15 DIAGNOSIS — Z556 Problems related to health literacy: Secondary | ICD-10-CM

## 2022-03-15 DIAGNOSIS — L304 Erythema intertrigo: Secondary | ICD-10-CM

## 2022-03-15 DIAGNOSIS — E039 Hypothyroidism, unspecified: Secondary | ICD-10-CM

## 2022-03-15 DIAGNOSIS — F5105 Insomnia due to other mental disorder: Secondary | ICD-10-CM

## 2022-03-15 DIAGNOSIS — Z8616 Personal history of COVID-19: Secondary | ICD-10-CM

## 2022-03-15 DIAGNOSIS — R0602 Shortness of breath: Secondary | ICD-10-CM

## 2022-03-15 DIAGNOSIS — F79 Unspecified intellectual disabilities: Secondary | ICD-10-CM | POA: Diagnosis not present

## 2022-03-15 DIAGNOSIS — M545 Low back pain, unspecified: Secondary | ICD-10-CM | POA: Diagnosis not present

## 2022-03-15 DIAGNOSIS — H5461 Unqualified visual loss, right eye, normal vision left eye: Secondary | ICD-10-CM

## 2022-03-15 DIAGNOSIS — G4762 Sleep related leg cramps: Secondary | ICD-10-CM | POA: Diagnosis not present

## 2022-03-15 DIAGNOSIS — E559 Vitamin D deficiency, unspecified: Secondary | ICD-10-CM | POA: Diagnosis not present

## 2022-03-15 NOTE — Telephone Encounter (Signed)
[place] FORMS received via fax Type of Form: _x__ CERTIFICATION       ___ RECERTIFICATION GREEN charge sheet attached and placed in provider folder at front desk. ~~~ route to CMA/provider Team

## 2022-03-16 NOTE — Telephone Encounter (Signed)
Forms were duplicate original forms signed and faxed with fax conformation.

## 2022-03-16 NOTE — Telephone Encounter (Signed)
Done

## 2022-03-27 ENCOUNTER — Ambulatory Visit (INDEPENDENT_AMBULATORY_CARE_PROVIDER_SITE_OTHER): Payer: Medicare Other | Admitting: Family Medicine

## 2022-03-27 ENCOUNTER — Encounter: Payer: Self-pay | Admitting: Family Medicine

## 2022-03-27 DIAGNOSIS — T300 Burn of unspecified body region, unspecified degree: Secondary | ICD-10-CM

## 2022-03-27 DIAGNOSIS — X101XXD Contact with hot food, subsequent encounter: Secondary | ICD-10-CM
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
## 2022-03-31 ENCOUNTER — Other Ambulatory Visit: Payer: Self-pay | Admitting: Family Medicine

## 2022-03-31 DIAGNOSIS — E039 Hypothyroidism, unspecified: Secondary | ICD-10-CM

## 2022-04-14 ENCOUNTER — Telehealth: Payer: Self-pay | Admitting: Family Medicine

## 2022-04-14 NOTE — Telephone Encounter (Signed)
Type of forms received:audit care home  Routed to: tequila  Paperwork received by : terrill   Individual made aware of 3-5 business day turn around (Y/N):yes  Form completed and patient made aware of charges(Y/N): yes  Faxed to :   Form location:  dr Ethelene Hal box

## 2022-04-18 ENCOUNTER — Telehealth: Payer: Self-pay | Admitting: Family Medicine

## 2022-04-18 NOTE — Telephone Encounter (Signed)
Forms received signed and faxed

## 2022-04-18 NOTE — Telephone Encounter (Signed)
Wannetta Sender [phone] from home health would like to extend the pt orders  home health therapy

## 2022-04-18 NOTE — Telephone Encounter (Signed)
Verbal orders given  

## 2022-04-21 ENCOUNTER — Telehealth: Payer: Self-pay | Admitting: Family Medicine

## 2022-04-21 ENCOUNTER — Ambulatory Visit: Payer: Medicare Other

## 2022-04-21 NOTE — Telephone Encounter (Signed)
Form signed and up front ready for pick up

## 2022-04-21 NOTE — Telephone Encounter (Signed)
error 

## 2022-04-21 NOTE — Telephone Encounter (Signed)
[name] stopped by to pick up pt's FL2 form. She will come back on Tuesday to pick up. [name] at   [phone] [facility])

## 2022-05-03 ENCOUNTER — Ambulatory Visit (INDEPENDENT_AMBULATORY_CARE_PROVIDER_SITE_OTHER): Payer: Medicare Other | Admitting: Family Medicine

## 2022-05-03 VITALS — BP 128/84 | HR 61 | Temp 97.8°F | Ht 62.0 in | Wt 172.6 lb

## 2022-05-03 DIAGNOSIS — J069 Acute upper respiratory infection, unspecified: Secondary | ICD-10-CM | POA: Diagnosis not present

## 2022-05-03 LAB — POCT INFLUENZA A/B
Influenza A, POC: NEGATIVE
Influenza B, POC: NEGATIVE
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
## 2022-05-15 ENCOUNTER — Ambulatory Visit: Payer: Medicare Other | Admitting: Family Medicine

## 2022-05-18 ENCOUNTER — Encounter: Payer: Self-pay | Admitting: Family Medicine

## 2022-05-18 ENCOUNTER — Ambulatory Visit (INDEPENDENT_AMBULATORY_CARE_PROVIDER_SITE_OTHER): Payer: 59 | Admitting: Family Medicine

## 2022-05-18 VITALS — BP 148/82 | HR 66 | Temp 98.4°F | Ht 62.0 in | Wt 170.2 lb

## 2022-05-18 DIAGNOSIS — M545 Low back pain, unspecified: Secondary | ICD-10-CM

## 2022-05-18 DIAGNOSIS — G4762 Sleep related leg cramps: Secondary | ICD-10-CM

## 2022-05-18 DIAGNOSIS — F79 Unspecified intellectual disabilities: Secondary | ICD-10-CM | POA: Diagnosis not present

## 2022-05-18 DIAGNOSIS — T300 Burn of unspecified body region, unspecified degree: Secondary | ICD-10-CM

## 2022-05-18 DIAGNOSIS — H547 Unspecified visual loss: Secondary | ICD-10-CM | POA: Diagnosis not present

## 2022-05-18 DIAGNOSIS — R2681 Unsteadiness on feet: Secondary | ICD-10-CM | POA: Diagnosis not present

## 2022-05-18 DIAGNOSIS — R0602 Shortness of breath: Secondary | ICD-10-CM

## 2022-05-18 DIAGNOSIS — F5105 Insomnia due to other mental disorder: Secondary | ICD-10-CM

## 2022-05-18 DIAGNOSIS — H5461 Unqualified visual loss, right eye, normal vision left eye: Secondary | ICD-10-CM

## 2022-05-18 DIAGNOSIS — L304 Erythema intertrigo: Secondary | ICD-10-CM

## 2022-05-18 DIAGNOSIS — E559 Vitamin D deficiency, unspecified: Secondary | ICD-10-CM

## 2022-05-18 DIAGNOSIS — E039 Hypothyroidism, unspecified: Secondary | ICD-10-CM

## 2022-05-18 DIAGNOSIS — Z556 Problems related to health literacy: Secondary | ICD-10-CM

## 2022-05-18 DIAGNOSIS — Z8616 Personal history of COVID-19: Secondary | ICD-10-CM
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
## 2022-05-19 ENCOUNTER — Telehealth: Payer: Self-pay | Admitting: Family Medicine

## 2022-05-19 ENCOUNTER — Other Ambulatory Visit: Payer: Self-pay | Admitting: Family Medicine

## 2022-05-19 DIAGNOSIS — Z7289 Other problems related to lifestyle: Secondary | ICD-10-CM

## 2022-05-19 DIAGNOSIS — F5105 Insomnia due to other mental disorder: Secondary | ICD-10-CM

## 2022-05-19 NOTE — Telephone Encounter (Signed)
Noted script was just sent in by provider.

## 2022-05-19 NOTE — Telephone Encounter (Signed)
Caller Name:  Call back phone #:    MEDICATION(S):  QUEtiapine (SEROQUEL) 100 MG tablet [[id number]]   Days of Med Remaining: one day    Has the patient contacted their pharmacy (YES/NO)? Yes contact your pcp    Preferred Pharmacy:  [facility], [address], [place] [address] Phone: [phone]  Fax: [phone]

## 2022-05-26 ENCOUNTER — Ambulatory Visit: Payer: 59 | Admitting: Podiatry

## 2022-07-03 ENCOUNTER — Ambulatory Visit: Payer: 59 | Admitting: Podiatry

## 2022-07-05 ENCOUNTER — Encounter: Payer: Self-pay | Admitting: Podiatry

## 2022-07-05 ENCOUNTER — Ambulatory Visit (INDEPENDENT_AMBULATORY_CARE_PROVIDER_SITE_OTHER): Payer: 59 | Admitting: Podiatry

## 2022-07-05 DIAGNOSIS — L84 Corns and callosities: Secondary | ICD-10-CM | POA: Diagnosis not present

## 2022-07-05 DIAGNOSIS — M79675 Pain in left toe(s): Secondary | ICD-10-CM | POA: Diagnosis not present

## 2022-07-05 DIAGNOSIS — M79674 Pain in right toe(s): Secondary | ICD-10-CM

## 2022-07-05 DIAGNOSIS — B351 Tinea unguium: Secondary | ICD-10-CM | POA: Diagnosis not present

## 2022-07-05 NOTE — Progress Notes (Signed)
Complaint:  Visit Type: Patient returns to my office for continued preventative foot care services.  Patient states" my nails have grown long and thick and become painful to walk and wear shoes".    The patient presents for preventative foot care services. She presents to the office with her caregiver.    Podiatric Exam: Vascular: dorsalis pedis and posterior tibial pulses are palpable bilateral. Capillary return is immediate. Temperature gradient is WNL. Skin turgor WNL  Sensorium: Normal Semmes Weinstein monofilament test. Normal tactile sensation bilaterally. Nail Exam: Pt has thick disfigured discolored nails with subungual debris noted bilateral entire nail hallux through fifth toenails Ulcer Exam: There is no evidence of ulcer or pre-ulcerative changes or infection. Orthopedic Exam: Muscle tone and strength are WNL. No limitations in general ROM. No crepitus or effusions noted. Foot type and digits show no abnormalities. Bony prominences are unremarkable. Pes planus. Skin: No Porokeratosis. No infection or ulcers.  Pinch callus symptomatic. B/L.  Diagnosis:  Onychomycosis, , Pain in right toe, pain in left toes,  Pinch callus hallux B/L.  Treatment & Plan Procedures and Treatment: Consent by patient was obtained for treatment procedures.   Debridement of mycotic and hypertrophic toenails, 1 through 5 bilateral and clearing of subungual debris using nail nipper followed by dremel..Debride callus with # 15 blade and dremel tool. No ulceration, no infection noted.  Return Visit-Office Procedure: Patient instructed to return to the office for a follow up visit  10 weeks for continued evaluation and treatment.    Gardiner Barefoot DPM

## 2022-07-11 ENCOUNTER — Telehealth: Payer: Self-pay | Admitting: Family Medicine

## 2022-07-11 DIAGNOSIS — E559 Vitamin D deficiency, unspecified: Secondary | ICD-10-CM

## 2022-07-11 NOTE — Telephone Encounter (Signed)
Prescription Request  07/11/2022  LOV: 05/18/2022  What is the name of the medication or equipment? Vit D  Have you contacted your pharmacy to request a refill? Yes   Which pharmacy would you like this sent to?  [facility], [address] [place] [address] Phone: [phone] Fax: [phone]    Patient notified that their request is being sent to the clinical staff for review and that they should receive a response within 2 business days.   Please advise at Mobile [phone] (mobile)

## 2022-07-12 MED ORDER — D3 SUPER STRENGTH 50 MCG (2000 UT) PO CAPS: 2000.0000 [IU] | ORAL_CAPSULE | Freq: Every day | ORAL | 3 refills | Status: DC

## 2022-07-17 NOTE — Telephone Encounter (Signed)
Pt checking on status of this message.  °

## 2022-07-18 ENCOUNTER — Other Ambulatory Visit: Payer: Self-pay

## 2022-07-18 DIAGNOSIS — E559 Vitamin D deficiency, unspecified: Secondary | ICD-10-CM

## 2022-07-18 MED ORDER — D3 SUPER STRENGTH 50 MCG (2000 UT) PO CAPS: 2000.0000 [IU] | ORAL_CAPSULE | Freq: Every day | ORAL | 3 refills | Status: DC

## 2022-07-18 NOTE — Telephone Encounter (Signed)
Original refill sent on 07/12/22 per pharmacy they did not receive it. Resent Rx patient aware

## 2022-08-05 IMAGING — MG MM DIGITAL SCREENING BILAT W/ TOMO AND CAD
6 of 10 series · 6 of 30 positions shown · non-contrast
Comparison: Previous exam(s).

CLINICAL DATA: Screening.

EXAM:
DIGITAL SCREENING BILATERAL MAMMOGRAM WITH TOMOSYNTHESIS AND CAD
TECHNIQUE: Bilateral screening digital craniocaudal and mediolateral oblique
mammograms were obtained. Bilateral screening digital breast
tomosynthesis was performed. The images were evaluated with
computer-aided detection.

[R MLO synth-2D]
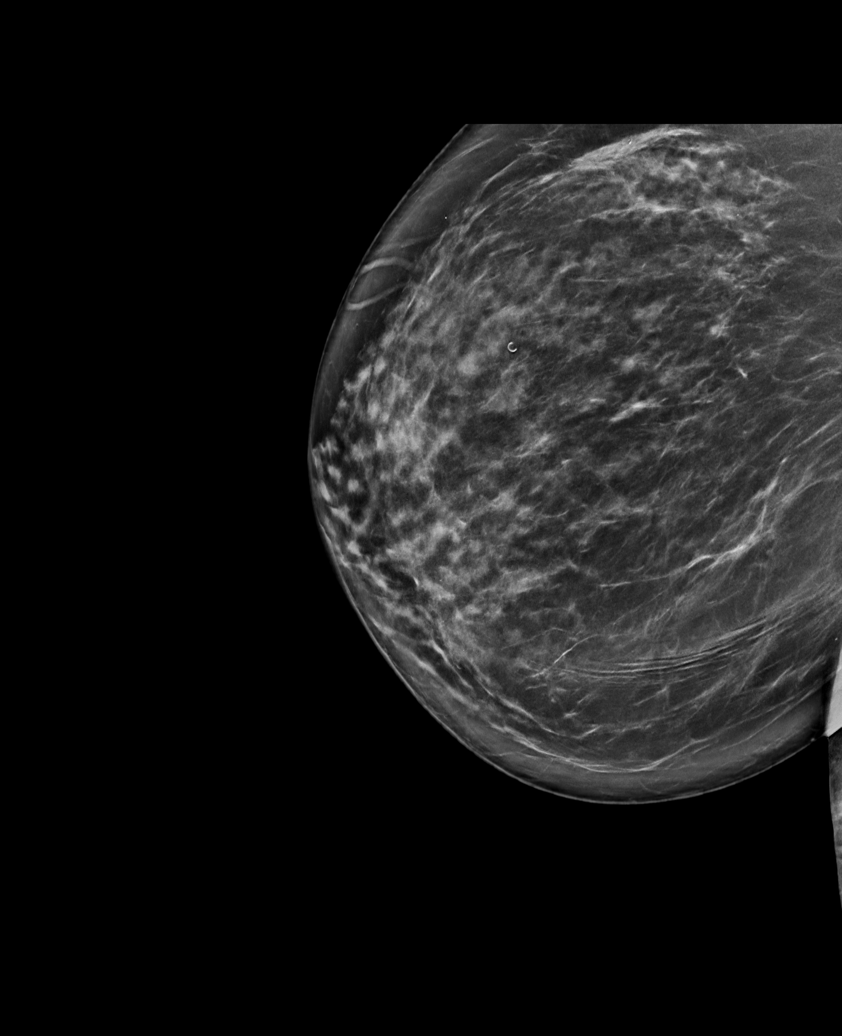

[L MLO synth-2D]
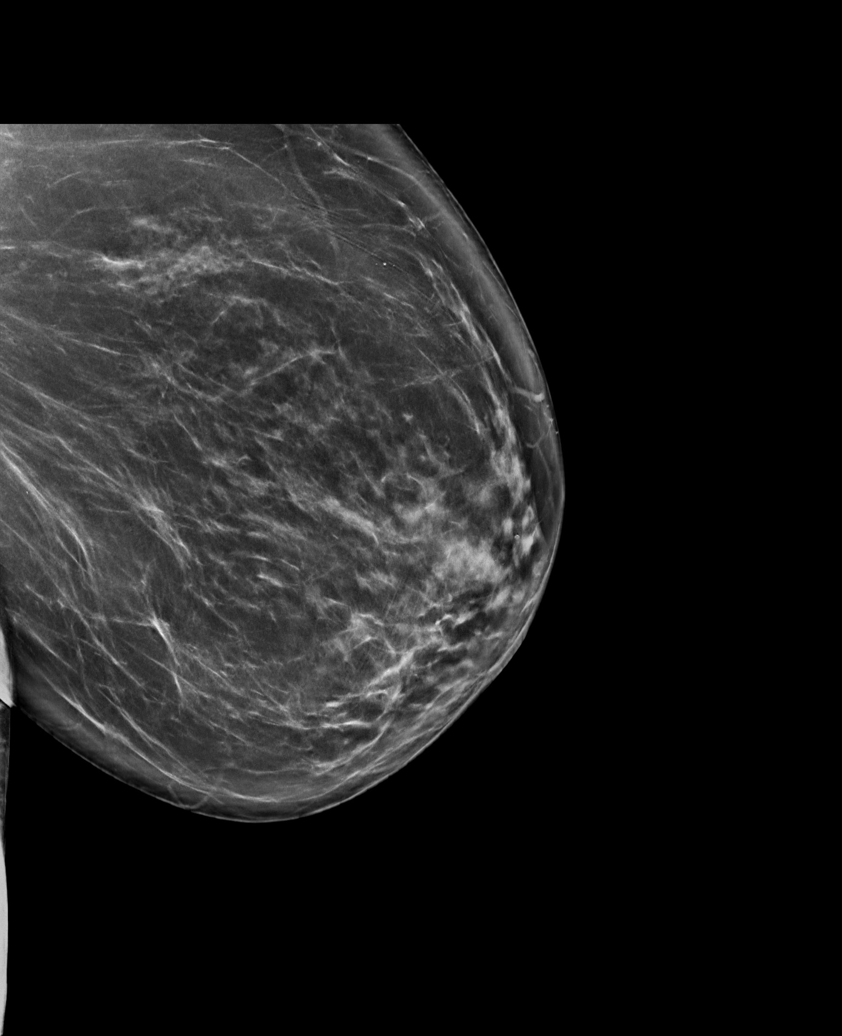

[L CC synth-2D]
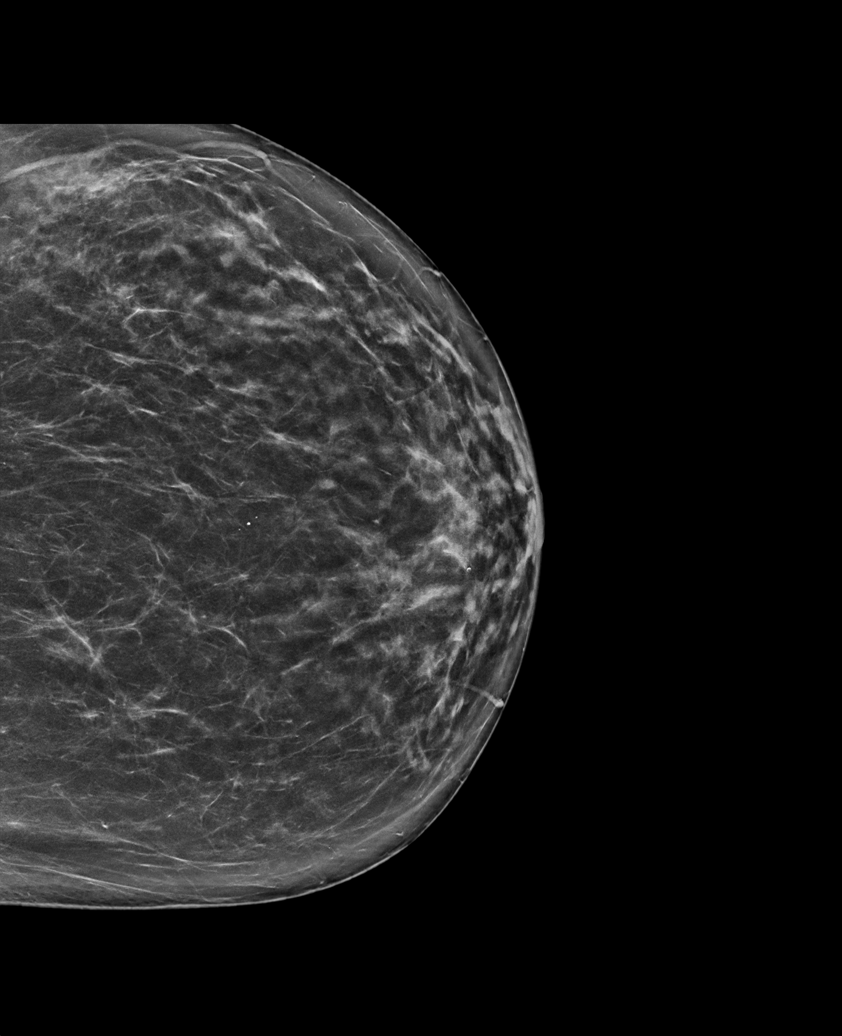

[R CC synth-2D (1 of 2)]
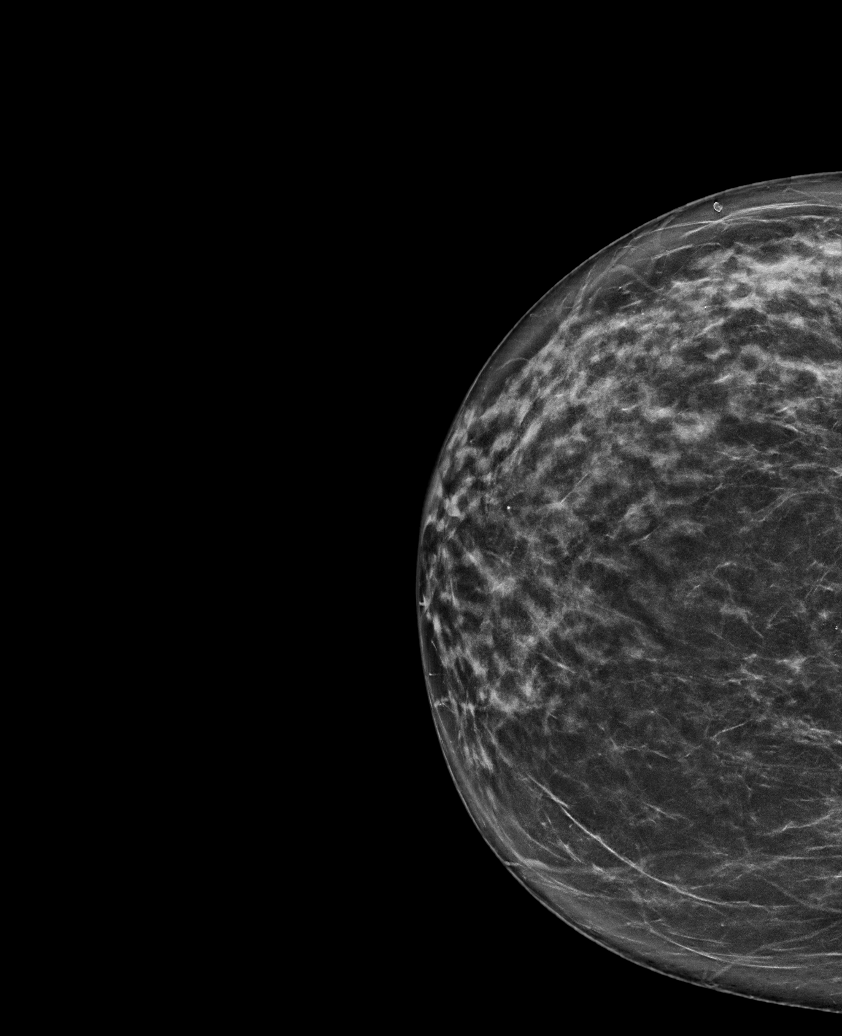

[R CC synth-2D (2 of 2)]
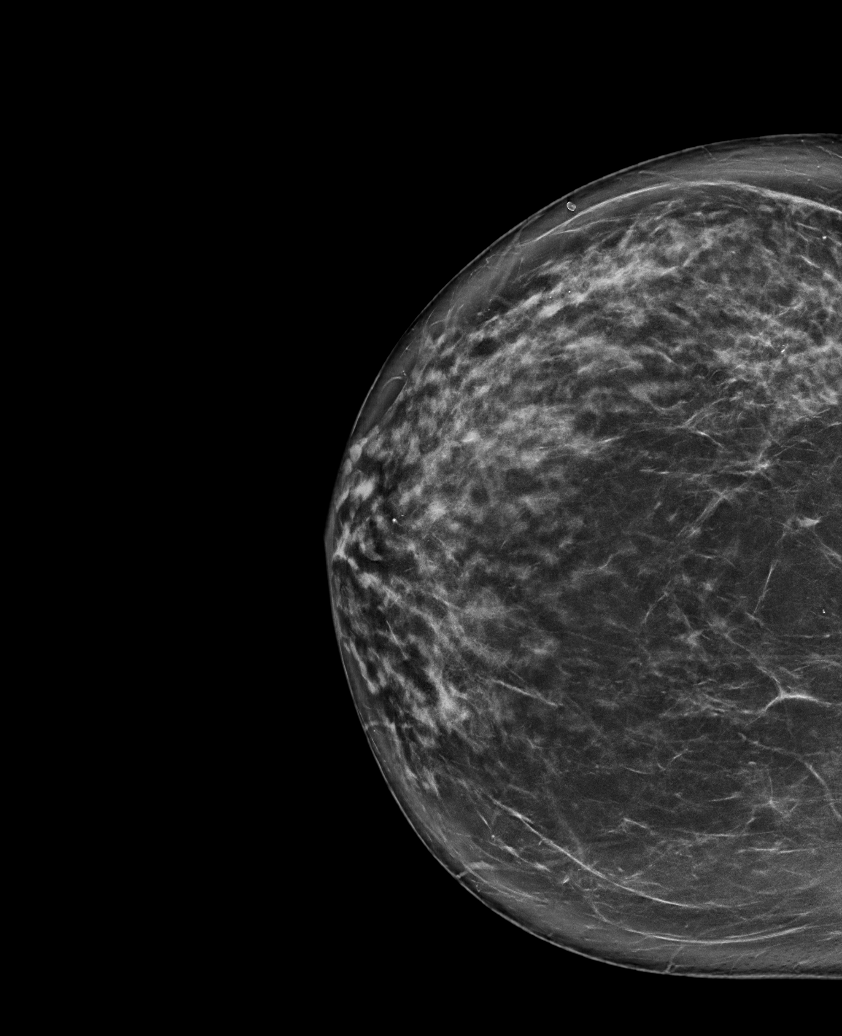

[L MLO tomo · tomo slice 52/103.0]
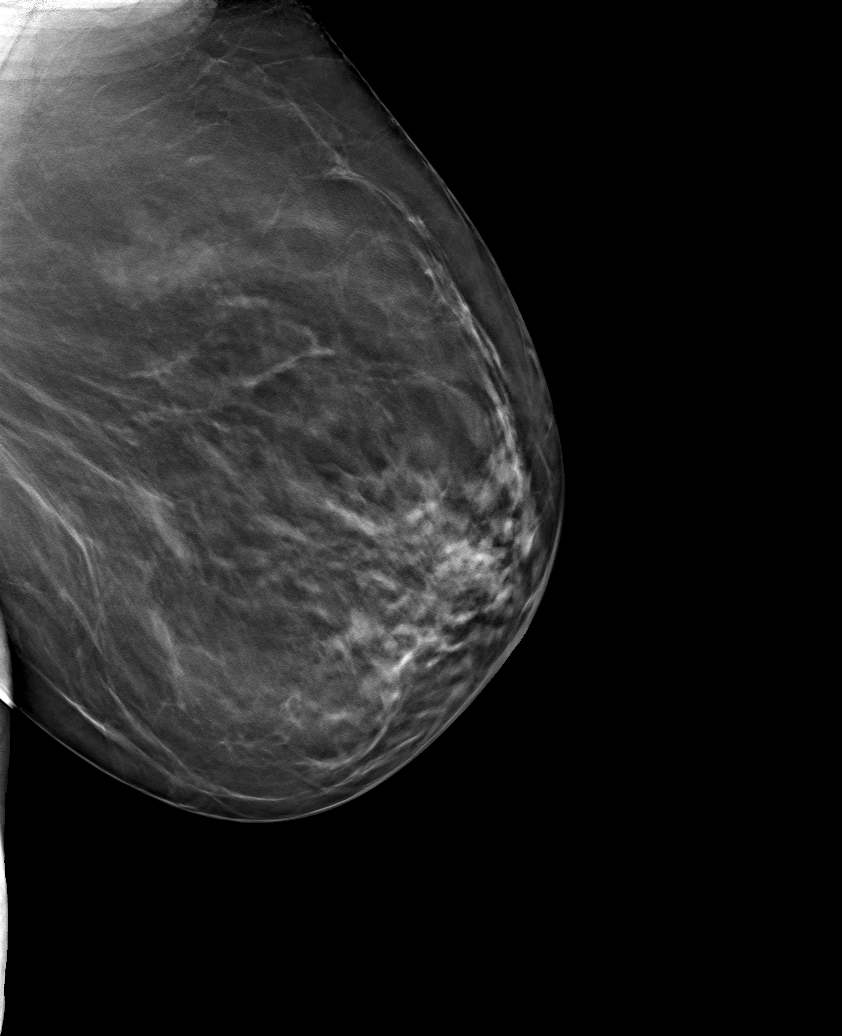

[6 of 30 positions shown; findings below may reference images not displayed]

ACR Breast Density Category b: There are scattered areas of
fibroglandular density.
FINDINGS: There are no findings suspicious for malignancy.
IMPRESSION: No mammographic evidence of malignancy. A result letter of this
screening mammogram will be mailed directly to the patient.

RECOMMENDATION:
Screening mammogram in one year. (Code:51-O-LD2)

BI-RADS CATEGORY  1: Negative.

## 2022-08-17 ENCOUNTER — Ambulatory Visit (INDEPENDENT_AMBULATORY_CARE_PROVIDER_SITE_OTHER): Payer: 59 | Admitting: Family Medicine

## 2022-08-17 ENCOUNTER — Encounter: Payer: Self-pay | Admitting: Family Medicine

## 2022-08-17 VITALS — BP 118/70 | HR 64 | Temp 97.8°F | Ht 62.0 in | Wt 173.6 lb

## 2022-08-17 DIAGNOSIS — R419 Unspecified symptoms and signs involving cognitive functions and awareness: Secondary | ICD-10-CM | POA: Diagnosis not present

## 2022-08-17 DIAGNOSIS — R4189 Other symptoms and signs involving cognitive functions and awareness: Secondary | ICD-10-CM

## 2022-08-17 DIAGNOSIS — E039 Hypothyroidism, unspecified: Secondary | ICD-10-CM

## 2022-08-17 NOTE — Progress Notes (Addendum)
Established Patient Office Visit   Subjective:  Patient ID: Abigail Webster, female    DOB: December 10, 1960  Age: 63 y.o. MRN: [id number]  Chief Complaint  Patient presents with   Chronic Care Management    Pt caregiver is requesting referral to neuro for consult on mental status     HPI Encounter Diagnoses  Name Primary?   Cognitive decline Yes   Acquired hypothyroidism    Presents with her caregiver for follow-up of hypothyroidism.  There is growing concern of multiple caregivers for Abigail Webster of some type of cognitive decline.  Over the last year show she is starting to require more help with ADLs.  Needs help with dressing.  Needs help toileting.  She is still able to feed herself but then will dump her plate.  She does repeat herself.  She has become lost in her group home.  She does not wander.  She can be irritating towards other home members.  She has become less inclined to participate in group activities.  Continues with Seroquel which has helped her sleep.  She has been less inclined towards self-mutilation after taking the Seroquel.  Seems to be having more difficulty fitting in with others in her group home.   Review of Systems  Constitutional: Negative.   HENT: Negative.    Eyes:  Negative for blurred vision, discharge and redness.  Respiratory: Negative.    Cardiovascular: Negative.   Gastrointestinal:  Negative for abdominal pain.  Genitourinary: Negative.   Musculoskeletal: Negative.  Negative for myalgias.  Skin:  Negative for rash.  Neurological:  Negative for tingling, loss of consciousness and weakness.  Endo/Heme/Allergies:  Negative for polydipsia.     Current Outpatient Medications:    Cholecalciferol (D3 SUPER STRENGTH) 50 MCG (2000 UT) CAPS, Take 1 capsule (2,000 Units total) by mouth daily., Disp: 30 capsule, Rfl: 3   Emollient (EUCERIN EX), Apply 1 application topically daily., Disp: , Rfl:    gabapentin (NEURONTIN) 100 MG capsule, Take 1 capsule (100 mg total)  by mouth at bedtime. As needed for leg cramps, Disp: 90 capsule, Rfl: 3   levothyroxine (SYNTHROID) 50 MCG tablet, TAKE (1) TABLET BY MOUTH ONCE DAILY IN THE MORNING ON A FASTING STOMACH 1 HOUR BEFORE EATING, Disp: 90 tablet, Rfl: 3   MYRBETRIQ 50 MG TB24 tablet, Take 50 mg by mouth daily., Disp: , Rfl:    QUEtiapine (SEROQUEL) 100 MG tablet, TAKE 1 TABLET BY MOUTH AT BEDTIME., Disp: 90 tablet, Rfl: 3   REFRESH TEARS 0.5 % SOLN, Place 1 drop into both eyes daily., Disp: , Rfl:    RESTASIS 0.05 % ophthalmic emulsion, Place 1 drop into both eyes 2 (two) times daily., Disp: , Rfl:    Scar Treatment Products [facility] ADVANCED SCAR GEL) GEL, Cover healed wounds daily with a thin coat., Disp: 50 g, Rfl: 1   silver sulfADIAZINE (SILVADENE) 1 % cream, Apply 1 Application topically daily., Disp: 50 g, Rfl: 1   Objective:     BP 118/70   Pulse 64   Temp 97.8 F (36.6 C) (Temporal)   Ht 5\' 2"  (1.575 m)   Wt 173 lb 9.6 oz (78.7 kg)   SpO2 95%   BMI 31.75 kg/m    Physical Exam Constitutional:      General: She is not in acute distress.    Appearance: Normal appearance. She is not ill-appearing, toxic-appearing or diaphoretic.  HENT:     Head: Normocephalic and atraumatic.     Right Ear:  External ear normal.     Left Ear: External ear normal.  Eyes:     General: No scleral icterus.       Right eye: No discharge.        Left eye: No discharge.     Extraocular Movements: Extraocular movements intact.     Conjunctiva/sclera: Conjunctivae normal.  Pulmonary:     Effort: Pulmonary effort is normal. No respiratory distress.  Skin:    General: Skin is warm and dry.  Neurological:     Mental Status: She is alert and oriented to person, place, and time.  Psychiatric:        Mood and Affect: Mood normal.        Behavior: Behavior normal. Behavior is cooperative.     Comments: Oriented to person time and place.  She knew who I was.  She did not know what year it is.  She did not know the date  and month.      No results found for any visits on 08/17/22.    The ASCVD Risk score (Arnett DK, et al., 2019) failed to calculate for the following reasons:   Cannot find a previous HDL lab   Cannot find a previous total cholesterol lab    Assessment & Plan:   Cognitive decline -     Ambulatory referral to Neurology  Acquired hypothyroidism -     TSH    Return in about 3 months (around 11/16/2022).  Concerns seem to be more related to behavior issues but her caregiver and others that work with Abigail Webster daily are more concerned about cognitive decline.  Have asked for a neuropsychology evaluation.  Abigail Sax, MD

## 2022-08-18 ENCOUNTER — Other Ambulatory Visit: Payer: 59

## 2022-08-23 ENCOUNTER — Telehealth: Payer: Self-pay | Admitting: Family Medicine

## 2022-08-23 DIAGNOSIS — E039 Hypothyroidism, unspecified: Secondary | ICD-10-CM

## 2022-08-23 MED ORDER — LEVOTHYROXINE SODIUM 50 MCG PO TABS
ORAL_TABLET | ORAL | 3 refills | Status: DC
Start: 2022-08-23 — End: 2023-08-20

## 2022-08-23 NOTE — Telephone Encounter (Signed)
Requested Rx sent to new pharmacy.

## 2022-08-23 NOTE — Telephone Encounter (Signed)
Prescription Request  08/23/2022 call from [name]  LOV: 08/17/2022  What is the name of the medication or equipment? levothyroxine (SYNTHROID) 50 MCG tablet   No longer using [facility] and having trouble trying to get them to transfer RX - please send new RX  Have you contacted your pharmacy to request a refill? Yes   Which pharmacy would you like this sent to?  [id number] - [place], [place] - [address] [place] [address] Phone: [phone] Fax: [phone]    Patient notified that their request is being sent to the clinical staff for review and that they should receive a response within 2 business days.   Please advise at Mobile [phone] (mobile)

## 2022-08-29 ENCOUNTER — Other Ambulatory Visit: Payer: Self-pay | Admitting: Family Medicine

## 2022-08-29 DIAGNOSIS — F5105 Insomnia due to other mental disorder: Secondary | ICD-10-CM

## 2022-08-29 DIAGNOSIS — Z7289 Other problems related to lifestyle: Secondary | ICD-10-CM

## 2022-08-29 DIAGNOSIS — E039 Hypothyroidism, unspecified: Secondary | ICD-10-CM

## 2022-08-30 ENCOUNTER — Telehealth: Payer: Self-pay | Admitting: Family Medicine

## 2022-08-30 NOTE — Telephone Encounter (Signed)
PAPERWORK/FORMS received  Dropped off by: pt's friend Call back #: [phone] Individual made aware of 3-5 business day turn around (YES/NO): yes GREEN charge sheet completed and patient made aware of possible charge (YES/NO): yes Placed in provider folder at front desk. ~~~ route to CMA/provider Team  CLINICAL USE BELOW THIS LINE (use X to signify action taken)  ___ Form received and placed in providers office for signature. ___ Form completed and faxed to LOA Dept.  ___ Form completed & LVM to notify patient ready for pick up.  ___ Charge sheet and copy of form in front office folder for office supervisor.

## 2022-09-04 NOTE — Telephone Encounter (Signed)
CLINICAL USE BELOW THIS LINE (use X to signify taken)  ____Form received and placed in providers office for signature. ____Form completed and faxed to LOA Dept. __X__Form completed notifIed pt ready for pick up __X__Charge sheet & copy of form in front office folder for office supervisor.

## 2022-09-04 NOTE — Telephone Encounter (Signed)
CLINICAL USE BELOW THIS LINE (use X to signify taken)  __X__Form received and placed in providers office for signature. ____Form completed and faxed to LOA Dept. ____Form completed & LVM to notify pt ready for pick up ____Charge sheet & copy of form in front office folder for office supervisor.   

## 2022-09-19 ENCOUNTER — Encounter: Payer: Self-pay | Admitting: Podiatry

## 2022-10-05 ENCOUNTER — Ambulatory Visit: Payer: 59 | Admitting: Podiatry

## 2022-10-22 ENCOUNTER — Emergency Department (HOSPITAL_BASED_OUTPATIENT_CLINIC_OR_DEPARTMENT_OTHER)
Admission: EM | Admit: 2022-10-22 | Discharge: 2022-10-22 | Disposition: A | Discharge status: Home / Self Care | Payer: 59 | Attending: Emergency Medicine | Admitting: Emergency Medicine

## 2022-10-22 ENCOUNTER — Other Ambulatory Visit: Payer: Self-pay

## 2022-10-22 ENCOUNTER — Emergency Department (HOSPITAL_BASED_OUTPATIENT_CLINIC_OR_DEPARTMENT_OTHER): Payer: 59

## 2022-10-22 ENCOUNTER — Encounter (HOSPITAL_BASED_OUTPATIENT_CLINIC_OR_DEPARTMENT_OTHER): Payer: Self-pay

## 2022-10-22 DIAGNOSIS — R220 Localized swelling, mass and lump, head: Secondary | ICD-10-CM

## 2022-10-22 DIAGNOSIS — E039 Hypothyroidism, unspecified: Secondary | ICD-10-CM | POA: Diagnosis not present

## 2022-10-22 DIAGNOSIS — K047 Periapical abscess without sinus: Secondary | ICD-10-CM

## 2022-10-22 DIAGNOSIS — Z79899 Other long term (current) drug therapy: Secondary | ICD-10-CM | POA: Insufficient documentation

## 2022-10-22 HISTORY — DX: Unspecified intellectual disabilities: F79

## 2022-10-22 LAB — CBC WITH DIFFERENTIAL/PLATELET
Abs Immature Granulocytes: 0.01 10*3/uL (ref 0.00–0.07)
Basophils Absolute: 0 10*3/uL (ref 0.0–0.1)
Basophils Relative: 0 %
Eosinophils Absolute: 0.1 10*3/uL (ref 0.0–0.5)
Eosinophils Relative: 2 %
HCT: 38.5 % (ref 36.0–46.0)
Hemoglobin: 12.3 g/dL (ref 12.0–15.0)
Immature Granulocytes: 0 %
Lymphocytes Relative: 21 %
Lymphs Abs: 1.3 10*3/uL (ref 0.7–4.0)
MCH: 30 pg (ref 26.0–34.0)
MCHC: 31.9 g/dL (ref 30.0–36.0)
MCV: 93.9 fL (ref 80.0–100.0)
Monocytes Absolute: 0.5 10*3/uL (ref 0.1–1.0)
Monocytes Relative: 8 %
Neutro Abs: 4.2 10*3/uL (ref 1.7–7.7)
Neutrophils Relative %: 69 %
Platelets: 271 10*3/uL (ref 150–400)
RBC: 4.1 MIL/uL (ref 3.87–5.11)
RDW: 13.7 % (ref 11.5–15.5)
WBC: 6.1 10*3/uL (ref 4.0–10.5)
nRBC: 0 % (ref 0.0–0.2)

## 2022-10-22 LAB — COMPREHENSIVE METABOLIC PANEL
ALT: 20 U/L (ref 0–44)
AST: 20 U/L (ref 15–41)
Albumin: 3.9 g/dL (ref 3.5–5.0)
Alkaline Phosphatase: 91 U/L (ref 38–126)
Anion gap: 8 (ref 5–15)
BUN: 15 mg/dL (ref 8–23)
CO2: 27 mmol/L (ref 22–32)
Calcium: 8.9 mg/dL (ref 8.9–10.3)
Chloride: 104 mmol/L (ref 98–111)
Creatinine, Ser: 1.26 mg/dL — ABNORMAL HIGH (ref 0.44–1.00)
GFR, Estimated: 48 mL/min — ABNORMAL LOW (ref >60)
Glucose, Bld: 88 mg/dL (ref 70–99)
Potassium: 4.3 mmol/L (ref 3.5–5.1)
Sodium: 139 mmol/L (ref 135–145)
Total Bilirubin: 0.8 mg/dL (ref 0.3–1.2)
Total Protein: 7.4 g/dL (ref 6.5–8.1)

## 2022-10-22 MED ORDER — EPINEPHRINE 0.3 MG/0.3ML IJ SOAJ
0.3000 mg | Freq: Once | INTRAMUSCULAR | Status: AC
  Administered 2022-10-22: 0.3 mg via INTRAMUSCULAR
  Filled 2022-10-22: qty 0.3

## 2022-10-22 MED ORDER — IOHEXOL 300 MG/ML  SOLN
75.0000 mL | Freq: Once | INTRAMUSCULAR | Status: AC | PRN
  Administered 2022-10-22: 75 mL via INTRAVENOUS

## 2022-10-22 MED ORDER — DEXAMETHASONE SODIUM PHOSPHATE 10 MG/ML IJ SOLN
12.0000 mg | Freq: Once | INTRAMUSCULAR | Status: AC
  Administered 2022-10-22: 12 mg via INTRAVENOUS
  Filled 2022-10-22: qty 2

## 2022-10-22 MED ORDER — DIPHENHYDRAMINE HCL 50 MG/ML IJ SOLN
50.0000 mg | Freq: Once | INTRAMUSCULAR | Status: AC
  Administered 2022-10-22: 50 mg via INTRAVENOUS
  Filled 2022-10-22: qty 1

## 2022-10-22 MED ORDER — EPINEPHRINE 0.3 MG/0.3ML IJ SOAJ: 0.3000 mg | INTRAMUSCULAR | 1 refills | Status: AC | PRN

## 2022-10-22 MED ORDER — PREDNISONE 20 MG PO TABS: ORAL_TABLET | ORAL | 0 refills | Status: DC

## 2022-10-22 MED ORDER — FAMOTIDINE IN NACL 20-0.9 MG/50ML-% IV SOLN
20.0000 mg | Freq: Once | INTRAVENOUS | Status: AC
  Administered 2022-10-22: 20 mg via INTRAVENOUS
  Filled 2022-10-22: qty 50

## 2022-10-22 MED ORDER — PENICILLIN V POTASSIUM 500 MG PO TABS: 500.0000 mg | ORAL_TABLET | Freq: Four times a day (QID) | ORAL | 0 refills | Status: AC

## 2022-10-22 NOTE — ED Provider Notes (Signed)
[facility] [place] Provider Note   CSN: [id number] Arrival date & time: 10/22/22  4098     History {Add pertinent medical, surgical, social history, OB history to HPI:1} Chief Complaint  Patient presents with   Facial Swelling    Abigail Webster is a 62 y.o. female.  HPI     62 year old female with a history of hypothyroidism    Family was concerned regarding some type of cognitive decline, needing more help with ADLs, has become lost in her group home Past Medical History:  Diagnosis Date   Hypothyroidism    Thyroid disease      Home Medications Prior to Admission medications   Medication Sig Start Date End Date Taking? Authorizing Provider  Cholecalciferol (D3 SUPER STRENGTH) 50 MCG (2000 UT) CAPS Take 1 capsule (2,000 Units total) by mouth daily. 07/18/22   Mliss Sax, MD  Emollient (EUCERIN EX) Apply 1 application topically daily.    [provider]  gabapentin (NEURONTIN) 100 MG capsule Take 1 capsule (100 mg total) by mouth at bedtime. As needed for leg cramps 02/09/22   Mliss Sax, MD  levothyroxine (SYNTHROID) 50 MCG tablet TAKE (1) TABLET BY MOUTH ONCE DAILY IN THE MORNING ON A FASTING STOMACH 1 HOUR BEFORE EATING 08/23/22   Mliss Sax, MD  MYRBETRIQ 50 MG TB24 tablet Take 50 mg by mouth daily. 08/10/20   [provider]  QUEtiapine (SEROQUEL) 100 MG tablet TAKE 1 TABLET BY MOUTH AT BEDTIME. 08/29/22   Mliss Sax, MD  REFRESH TEARS 0.5 % SOLN Place 1 drop into both eyes daily. 06/07/20   [provider]  RESTASIS 0.05 % ophthalmic emulsion Place 1 drop into both eyes 2 (two) times daily. 05/22/18   [provider]  Scar Treatment Products [facility] ADVANCED SCAR GEL) GEL Cover healed wounds daily with a thin coat. 03/13/22   Mliss Sax, MD  silver sulfADIAZINE (SILVADENE) 1 % cream Apply 1 Application topically daily. 02/27/22   Mliss Sax, MD      Allergies    Patient has no known allergies.    Review of Systems   Review of Systems  Physical Exam Updated Vital Signs BP (!) 154/79 (BP Location: Left Arm)   Pulse 64   Temp (!) 96.3 F (35.7 C) (Temporal)   Resp 20   Ht 5\' 2"  (1.575 m)   Wt 78 kg   SpO2 98%   BMI 31.45 kg/m  Physical Exam  ED Results / Procedures / Treatments   Labs (all labs ordered are listed, but only abnormal results are displayed) Labs Reviewed - No data to display  EKG None  Radiology No results found.  Procedures Procedures  {Document cardiac monitor, telemetry assessment procedure when appropriate:1}  Medications Ordered in ED Medications - No data to display  ED Course/ Medical Decision Making/ A&P   {   Click here for ABCD2, HEART and other calculatorsREFRESH Note before signing :1}                           ***  Differential diagnosis includes angioedema to include allergic or idiopathic etiologies, parotitis, facial abscess.  Labs completed and personally about interpreted by me show no leukocytosis, no anemia, similar creatinine to prior no clinically significant electrolyte abnormalities.  She is not on an ACE inhibitor, does not have signs of anaphylaxis, however given significant facial swelling with limited  history, gave EpiPen, Decadron, Benadryl and famotidine for empiric treatment of angioedema and possibility of allergic pathology.  Given concern for possible tenderness on exam, history limited regarding her pain, fullness, also consider infection on the differential, ordered CT maxillofacial to evaluate for signs of abscess.  CT completed shows multiple periapical lucencies and dental caries most notable in the right mandibular molars as well as along the left maxillary sinus or incisor, low-density fluid in the left maxillary central incisor measuring 8 x 10 mm, asymmetric soft tissue swelling in the supraorbital soft tissues of the right  without evidence of postseptal extension.   {Document critical care time when appropriate:1} {Document review of labs and clinical decision tools ie heart score, Chads2Vasc2 etc:1}  {Document your independent review of radiology images, and any outside records:1} {Document your discussion with family members, caretakers, and with consultants:1} {Document social determinants of health affecting pt's care:1} {Document your decision making why or why not admission, treatments were needed:1} Final Clinical Impression(s) / ED Diagnoses Final diagnoses:  None    Rx / DC Orders ED Discharge Orders     None

## 2022-10-22 NOTE — ED Triage Notes (Signed)
Patient woke up with swelling to the right side of her face. Her eye is almost swollen shut and her lips are swollen. No tongue swelling and no difficulty swallowing.

## 2022-10-22 NOTE — Discharge Instructions (Addendum)
1.  Your CT scan shows significant decay and very small abscess areas around some of the teeth.  You are being started on an antibiotic.  Take 4 times daily as prescribed.  Make an appointment with your dentist as soon as possible for recheck. 2.  Your swelling has improved in the emergency department which suggest possible allergic reaction.  At this time since you may have had allergic reaction versus facial infection, you are to continue taking prednisone, an anti-inflammatory for the next 4 days.  Also take 1-2 Benadryl tablets every 6 hours for any facial swelling or itching.  Immediately return to the emergency department if you are having swelling around your mouth any difficulty breathing swallowing or other concerning symptoms. 3.  Since you had a recent peanut butter cup earlier, avoid all peanut and nut products at this time.  Discussed allergy testing with your doctor.

## 2022-10-22 NOTE — ED Provider Notes (Signed)
Patient went to bed normal at baseline last night.  She awakened this morning with large swelling of her upper lip and right eyelid.  Her caregiver reports there were no pain complaints.  Initial treatment in the emergency department started by Dr. Dalene Seltzer was for both potential angioedema and allergic reaction as well as diagnostic evaluation for facial infection.  Patient has responded to combination of Decadron, Pepcid, Benadryl and epinephrine.  Her caregiver reports that the swelling is completely resolved and is back to normal baseline.  Patient has been under observation for 5 hours.  Is no sign of rebound.  Airway is widely patent.  She denies she is have any symptoms and feels well. Physical Exam  BP 137/80 (BP Location: Right Arm)   Pulse 68   Temp 98 F (36.7 C) (Oral)   Resp 18   Ht 5\' 2"  (1.575 m)   Wt 78 kg   SpO2 97%   BMI 31.45 kg/m   Physical Exam Constitutional:      Comments: Patient is alert no acute distress.  No respiratory distress.  Interactive at cognitive baseline.  HENT:     Head:     Comments: Patient's facial features at baseline are somewhat full in appearance.  I have examined pictures from her caregiver at normal baseline, at time of symptoms and current exam.  At this time she has returned to her normal baseline.  She does have generally rather firm and indurated skin over the neck and sides of the face, however her caregiver reports this is a baseline for her and the patient has no discomfort with palpation of the soft tissues of the neck.    Mouth/Throat:     Mouth: Mucous membranes are moist.     Pharynx: Oropharynx is clear.     Comments: Is widely patent.  Posterior oropharynx is clear.  Tongue has no swelling.  Patient does have very poor dentition with several molars that are decayed to the gumline with dental fracture and necrotic appearance.  However, patient is not endorsing tenderness to palpation over the facial bones.  Caregiver however qualifies  that the patient rarely complains of pain and is difficult to assess if she is experiencing pain. Eyes:     Comments: enucleated on the right.  No significant swelling.  Cardiovascular:     Rate and Rhythm: Normal rate and regular rhythm.  Pulmonary:     Effort: Pulmonary effort is normal. No respiratory distress.     Breath sounds: Normal breath sounds.  Skin:    General: Skin is warm and dry.     Findings: No rash.  Neurological:     Comments: Patient is at neurologic baseline per her caregiver.  Patient is verbally interactive.  She is purposeful and situationally oriented.  She is cooperative with appropriate verbal coaching.     Procedures  Procedures  ED Course / MDM    Medical Decision Making Amount and/or Complexity of Data Reviewed Labs: ordered. Radiology: ordered.  Risk Prescription drug management.   CT scan reviewed. IMPRESSION: 1. Multiple periapical lucencies and dental caries, most notably in the region of the right mandibular molars, as well as along a left maxillary central incisor. There is likely a low-density fluid collection adjacent to the dental caries in the left maxillary central incisor measuring 8 x 10 mm, which is favored to represent a small odontogenic soft tissue abscess. 2. Mild asymmetric soft tissue swelling in the supraorbital soft tissues on the right, which may  represent cellulitis. No evidence of post-septal extension or drainable fluid collection in this region.  At this time, based on CT results and previous facial swelling, will opt to treat with antibiotic therapy.  Differential diagnosis for the swelling includes allergic reaction\angioedema\facial infection.  Regards to facial infection, patient is nontoxic and comfortable.  No signs of any airway impingement.  Stable to start outpatient antibiotic therapy.  Patient's caregiver advises they will be able to establish dental follow-up.  Swelling has resolved and initial images were  suggestive of angioedema or urticaria.  At this point will continue with prednisone and Benadryl as needed for possible allergic reaction.  Review of medications does not suggest any likely culprits for angioedema.  Patient had eaten a [employer] peanut butter cup earlier in the day.  At this time we will make recommendations to avoid nuts and peanut butter until follow-up and allergy testing if indicated.      Arby Barrette, MD 10/22/22 [phone]

## 2022-10-23 ENCOUNTER — Telehealth: Payer: Self-pay

## 2022-10-24 NOTE — Transitions of Care (Post Inpatient/ED Visit) (Signed)
10/24/2022  Name: Abigail Webster MRN: [id number] DOB: 05-Jun-1960  Today's TOC FU Call Status: Today's TOC FU Call Status:: Successful TOC FU Call Competed TOC FU Call Complete Date: 10/23/22  Transition Care Management Follow-up Telephone Call Date of Discharge: 10/22/22 Discharge Facility: [facility] Type of Discharge: Emergency Department How have you been since you were released from the hospital?: Better Any questions or concerns?: No  Items Reviewed: Did you receive and understand the discharge instructions provided?: Yes Medications obtained,verified, and reconciled?: Yes (Medications Reviewed) Any new allergies since your discharge?: No Dietary orders reviewed?: NA Do you have support at home?: Yes  Medications Reviewed Today: Medications Reviewed Today     Reviewed by Larey Dresser, RN (Registered Nurse) on 10/24/22 at 1236  Med List Status: <None>   Medication Order Taking? Sig Documenting Provider Last Dose Status Informant  Cholecalciferol (D3 SUPER STRENGTH) 50 MCG (2000 UT) CAPS [id number]  Take 1 capsule (2,000 Units total) by mouth daily. Mliss Sax, MD  Active   Emollient [facility]) [phone] No Apply 1 application topically daily. [provider] Taking Active Other  EPINEPHrine 0.3 mg/0.3 mL IJ SOAJ injection [id number]  Inject 0.3 mg into the muscle as needed for anaphylaxis. Arby Barrette, MD  Active   gabapentin (NEURONTIN) 100 MG capsule [id number] No Take 1 capsule (100 mg total) by mouth at bedtime. As needed for leg cramps Mliss Sax, MD Taking Active   levothyroxine (SYNTHROID) 50 MCG tablet [id number]  TAKE (1) TABLET BY MOUTH ONCE DAILY IN THE MORNING ON A FASTING STOMACH 1 HOUR BEFORE EATING Mliss Sax, MD  Active   MYRBETRIQ 50 MG TB24 tablet [id number] No Take 50 mg by mouth daily. [provider] Taking Active Other  penicillin v potassium (VEETID) 500 MG tablet [id number]  Take 1  tablet (500 mg total) by mouth 4 (four) times daily for 7 days. Arby Barrette, MD  Active   predniSONE (DELTASONE) 20 MG tablet [id number]  2 tabs po daily x 4 days Arby Barrette, MD  Active   QUEtiapine (SEROQUEL) 100 MG tablet [id number]  TAKE 1 TABLET BY MOUTH AT BEDTIME. Mliss Sax, MD  Active   REFRESH TEARS 0.5 % SOLN [id number] No Place 1 drop into both eyes daily. [provider] Taking Active Other  RESTASIS 0.05 % ophthalmic emulsion [id number] No Place 1 drop into both eyes 2 (two) times daily. [provider] Taking Active Other  Scar Treatment Products [facility] ADVANCED SCAR GEL) GEL [id number] No Cover healed wounds daily with a thin coat. Mliss Sax, MD Taking Active   silver sulfADIAZINE (SILVADENE) 1 % cream [id number] No Apply 1 Application topically daily. Mliss Sax, MD Taking Active             Home Care and Equipment/Supplies: Were Home Health Services Ordered?: NA Any new equipment or medical supplies ordered?: NA  Functional Questionnaire: Do you need assistance with bathing/showering or dressing?: Yes Do you need assistance with meal preparation?: Yes Do you need assistance with eating?: Yes Do you have difficulty maintaining continence: Yes Do you need assistance with getting out of bed/getting out of a chair/moving?: Yes Do you have difficulty managing or taking your medications?: Yes  Follow up appointments reviewed: PCP Follow-up appointment confirmed?: NA Specialist Hospital Follow-up appointment confirmed?: NA Do you need transportation to your follow-up appointment?: No Do you understand care options if your condition(s) worsen?: Yes-patient verbalized understanding    SIGNATURE Arvil Persons,  BSN, RN

## 2022-11-12 ENCOUNTER — Other Ambulatory Visit: Payer: Self-pay | Admitting: Family Medicine

## 2022-11-12 DIAGNOSIS — F5105 Insomnia due to other mental disorder: Secondary | ICD-10-CM

## 2022-11-12 DIAGNOSIS — F99 Mental disorder, not otherwise specified: Secondary | ICD-10-CM

## 2022-11-12 DIAGNOSIS — Z7289 Other problems related to lifestyle: Secondary | ICD-10-CM

## 2022-11-14 ENCOUNTER — Telehealth: Payer: Self-pay | Admitting: Family Medicine

## 2022-11-14 ENCOUNTER — Ambulatory Visit: Payer: 59 | Admitting: Family Medicine

## 2022-11-14 NOTE — Telephone Encounter (Signed)
7.16.24 no  show letter sent

## 2022-11-20 ENCOUNTER — Other Ambulatory Visit: Payer: Self-pay | Admitting: Urology

## 2022-11-22 NOTE — Telephone Encounter (Signed)
1st no show, fee waived, letter sent to reschedule/notify of policy 

## 2022-11-28 ENCOUNTER — Ambulatory Visit: Payer: 59 | Admitting: Internal Medicine

## 2022-11-28 ENCOUNTER — Encounter: Payer: Self-pay | Admitting: Family

## 2022-11-28 ENCOUNTER — Ambulatory Visit (INDEPENDENT_AMBULATORY_CARE_PROVIDER_SITE_OTHER): Payer: 59 | Admitting: Family

## 2022-11-28 VITALS — BP 124/78 | HR 88 | Temp 98.2°F | Wt 175.0 lb

## 2022-11-28 DIAGNOSIS — J069 Acute upper respiratory infection, unspecified: Secondary | ICD-10-CM

## 2022-11-28 DIAGNOSIS — R051 Acute cough: Secondary | ICD-10-CM

## 2022-11-28 DIAGNOSIS — R0989 Other specified symptoms and signs involving the circulatory and respiratory systems: Secondary | ICD-10-CM

## 2022-11-28 LAB — POC COVID19 BINAXNOW: SARS Coronavirus 2 Ag: NEGATIVE

## 2022-11-28 MED ORDER — PREDNISONE 20 MG PO TABS
40.0000 mg | ORAL_TABLET | Freq: Every day | ORAL | 0 refills | Status: DC
Start: 2022-11-28 — End: 2023-01-10

## 2022-11-28 NOTE — Progress Notes (Signed)
Acute Office Visit  Subjective:     Patient ID: Abigail Webster, female    DOB: 1960/10/03, 62 y.o.   MRN: [id number]  Chief Complaint  Patient presents with   Nasal Congestion    Hoarse, congestion, runny nose, x Monday.    HPI Patient is in today with complaints of hoarseness, nasal congestion, cough, runny nose x 2 days.  Has not been taking any medication over-the-counter.  Goes to an adult daycare during the day.  Unaware of any known sick contacts.  She is here today with her caregiver.  Review of Systems  Constitutional:  Negative for chills and fever.  HENT:  Positive for congestion.        Runny nose and congestion  Respiratory:  Positive for cough.   Cardiovascular: Negative.   Musculoskeletal: Negative.   Neurological: Negative.   Psychiatric/Behavioral: Negative.    All other systems reviewed and are negative.   Past Medical History:  Diagnosis Date   Hypothyroidism    Mentally challenged    Thyroid disease     Social History   Socioeconomic History   Marital status: Single    Spouse name: Not on file   Number of children: Not on file   Years of education: Not on file   Highest education level: Not on file  Occupational History   Not on file  Tobacco Use   Smoking status: Never   Smokeless tobacco: Never  Vaping Use   Vaping status: Never Used  Substance and Sexual Activity   Alcohol use: Never   Drug use: Never   Sexual activity: Never  Other Topics Concern   Not on file  Social History Narrative   Not on file   Social Determinants of Health   Financial Resource Strain: Low Risk  (03/13/2022)   Overall Financial Resource Strain (CARDIA)    Difficulty of Paying Living Expenses: Not hard at all  Food Insecurity: No Food Insecurity (03/13/2022)   Hunger Vital Sign    Worried About Running Out of Food in the Last Year: Never true    Ran Out of Food in the Last Year: Never true  Transportation Needs: No Transportation Needs (03/13/2022)    PRAPARE - Administrator, Civil Service (Medical): No    Lack of Transportation (Non-Medical): No  Physical Activity: Insufficiently Active (03/13/2022)   Exercise Vital Sign    Days of Exercise per Week: 2 days    Minutes of Exercise per Session: 30 min  Stress: No Stress Concern Present (03/13/2022)   [employer] of Occupational Health - Occupational Stress Questionnaire    Feeling of Stress : Not at all  Social Connections: Moderately Integrated (03/09/2021)   Social Connection and Isolation Panel [NHANES]    Frequency of Communication with Friends and Family: Twice a week    Frequency of Social Gatherings with Friends and Family: Twice a week    Attends Religious Services: More than 4 times per year    Active Member of [employer] or Organizations: Yes    Attends Banker Meetings: More than 4 times per year    Marital Status: Never married  Intimate Partner Violence: Not At Risk (03/09/2021)   Humiliation, Afraid, Rape, and Kick questionnaire    Fear of Current or Ex-Partner: No    Emotionally Abused: No    Physically Abused: No    Sexually Abused: No    Past Surgical History:  Procedure Laterality Date   FOOT SURGERY  TRANSURETHRAL RESECTION OF BLADDER TUMOR WITH MITOMYCIN-C N/A 10/18/2020   Procedure: 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 30 capsule 3   Emollient (EUCERIN EX) Apply 1 application topically daily.     EPINEPHrine 0.3 mg/0.3 mL IJ  SOAJ injection Inject 0.3 mg into the muscle as needed for anaphylaxis. 2 each 1   gabapentin (NEURONTIN) 100 MG capsule Take 1 capsule (100 mg total) by mouth at bedtime. As needed for leg cramps 90 capsule 3   levothyroxine (SYNTHROID) 50 MCG tablet TAKE (1) TABLET BY MOUTH ONCE DAILY IN THE MORNING ON A FASTING STOMACH 1 HOUR BEFORE EATING 90 tablet 3   MYRBETRIQ 50 MG TB24 tablet Take 50 mg by mouth daily.     QUEtiapine (SEROQUEL) 100 MG tablet TAKE ONE TABLET BY MOUTH AT BEDTIME 30 tablet 5   REFRESH TEARS 0.5 % SOLN Place 1 drop into both eyes daily.     RESTASIS 0.05 % ophthalmic emulsion Place 1 drop into both eyes 2 (two) times daily.     Scar Treatment Products [facility] ADVANCED SCAR GEL) GEL Cover healed wounds daily with a thin coat. 50 g 1   silver sulfADIAZINE (SILVADENE) 1 % cream Apply 1 Application topically daily. (Patient not taking: Reported on 11/28/2022) 50 g 1   No current facility-administered medications on file prior to visit.    BP 124/78 (BP Location: Right Arm, Patient Position: Sitting, Cuff Size: Large)   Pulse 88   Temp 98.2 F (36.8 C) (Oral)   Wt 175 lb (79.4 kg)   SpO2 98%   BMI 32.01 kg/m chart     Objective:    BP 124/78 (BP Location: Right Arm, Patient Position: Sitting, Cuff Size: Large)   Pulse 88   Temp 98.2 F (36.8 C) (Oral)   Wt 175 lb (79.4 kg)   SpO2 98%   BMI 32.01 kg/m    Physical Exam Vitals and nursing note reviewed.  Constitutional:      Appearance: She is obese.  HENT:     Right Ear: Tympanic membrane, ear canal and external ear normal.     Left Ear: Tympanic membrane, ear canal and external ear normal.     Nose: Congestion and rhinorrhea present.     Mouth/Throat:     Pharynx: No oropharyngeal exudate or posterior oropharyngeal erythema.  Cardiovascular:     Rate and Rhythm: Normal rate and regular rhythm.  Pulmonary:     Effort: Pulmonary effort is normal.     Breath sounds: Normal breath sounds. No wheezing or  rhonchi.  Skin:    General: Skin is warm and dry.  Neurological:     General: No focal deficit present.     Mental Status: She is alert and oriented to person, place, and time. Mental status is at baseline.  Psychiatric:        Mood and Affect: Mood normal.        Behavior: Behavior  normal.     No results found for any visits on 11/28/22.      Assessment & Plan:   Problem List Items Addressed This Visit   None Visit Diagnoses     Runny nose    -  Primary   Relevant Orders   POC COVID-19 BinaxNow   Viral upper respiratory illness       Acute cough           Meds ordered this encounter  Medications   predniSONE (DELTASONE) 20 MG tablet    Sig: Take 2 tablets (40 mg total) by mouth daily with breakfast.    Dispense:  10 tablet    Refill:  0   Call the office if symptoms worsen or persist.  Recheck as scheduled and sooner as needed.  Encouraged over-the-counter Robitussin or Coricidin HBP to help with cough. No follow-ups on file.  Eulis Foster, FNP

## 2022-12-04 ENCOUNTER — Ambulatory Visit (INDEPENDENT_AMBULATORY_CARE_PROVIDER_SITE_OTHER): Payer: 59 | Admitting: Podiatry

## 2022-12-04 ENCOUNTER — Encounter: Payer: Self-pay | Admitting: Podiatry

## 2022-12-04 DIAGNOSIS — B351 Tinea unguium: Secondary | ICD-10-CM | POA: Diagnosis not present

## 2022-12-04 DIAGNOSIS — M79674 Pain in right toe(s): Secondary | ICD-10-CM

## 2022-12-04 DIAGNOSIS — M79675 Pain in left toe(s): Secondary | ICD-10-CM | POA: Diagnosis not present

## 2022-12-04 DIAGNOSIS — L84 Corns and callosities: Secondary | ICD-10-CM | POA: Diagnosis not present

## 2022-12-04 NOTE — Progress Notes (Signed)
Complaint:  Visit Type: Patient returns to my office for continued preventative foot care services.  Patient states" my nails have grown long and thick and become painful to walk and wear shoes".    The patient presents for preventative foot care services. She presents to the office with her caregiver.    Podiatric Exam: Vascular: dorsalis pedis and posterior tibial pulses are palpable bilateral. Capillary return is immediate. Temperature gradient is WNL. Skin turgor WNL  Sensorium: Normal Semmes Weinstein monofilament test. Normal tactile sensation bilaterally. Nail Exam: Pt has thick disfigured discolored nails with subungual debris noted bilateral entire nail hallux through fifth toenails Ulcer Exam: There is no evidence of ulcer or pre-ulcerative changes or infection. Orthopedic Exam: Muscle tone and strength are WNL. No limitations in general ROM. No crepitus or effusions noted. Foot type and digits show no abnormalities. Bony prominences are unremarkable. Pes planus. Skin: No Porokeratosis. No infection or ulcers.  Pinch callus symptomatic. B/L.  Diagnosis:  Onychomycosis, , Pain in right toe, pain in left toes,  Pinch callus hallux B/L.  Treatment & Plan Procedures and Treatment: Consent by patient was obtained for treatment procedures.   Debridement of mycotic and hypertrophic toenails, 1 through 5 bilateral and clearing of subungual debris using nail nipper followed by dremel..Debride callus with # 15 blade and dremel tool. No ulceration, no infection noted.  Return Visit-Office Procedure: Patient instructed to return to the office for a follow up visit  3 months  for continued evaluation and treatment.    Helane Gunther DPM

## 2022-12-17 NOTE — Progress Notes (Signed)
COVID Vaccine received:  []  No [x]  Yes Date of any COVID positive Test in last 90 days:  PCP - Nadene Rubins, MD at  [facility]  Cardiologist -   Chest x-ray - 02-11-2022  2v  Epic EKG -  no Hx to warrant Stress Test -  ECHO -  Cardiac Cath -   PCR screen: []  Ordered & Completed           []   No Order but Needs PROFEND           [x]   N/A for this surgery  Surgery Plan:  [x]  Ambulatory                            []  Outpatient in bed                            []  Admit  Anesthesia:    [x]  General  []  Spinal                           []   Choice []   MAC  Bowel Prep - [x]  No  []   Yes ______  Pacemaker / ICD device [x]  No []  Yes   Spinal Cord Stimulator:[x]  No []  Yes       History of Sleep Apnea? [x]  No []  Yes   CPAP used?- [x]  No []  Yes    Does the patient monitor blood sugar?          []  No []  Yes  [x]  N/A  Patient has: [x]  NO Hx DM   []  Pre-DM                 []  DM1  []   DM2 Does patient have a [employer] or Dexacom? []  No []  Yes   Fasting Blood Sugar Ranges-  Checks Blood Sugar _____ times a day  Blood Thinner / Instructions:  none Aspirin Instructions:  None  ERAS Protocol Ordered: [x]  No  []  Yes Patient is to be NPO after: Midnight prior  Comments: Patient lives in a Group Home and has a legal guardian. Her niece, Kiah Campoli is POA  Activity level: Patient is able / unable to climb a flight of stairs without difficulty; []  No CP  []  No SOB, but would have ___   Patient can / can not perform ADLs without assistance.   Anesthesia review: Neurocognitive disorder with recent cognitive decline. Blind in Right Eye, CKD3  Patient denies shortness of breath, fever, cough and chest pain at PAT appointment.  Patient verbalized understanding and agreement to the Pre-Surgical Instructions that were given to them at this PAT appointment. Patient was also educated of the need to review these PAT instructions again prior to her surgery.I reviewed the appropriate phone  numbers to call if they have any and questions or concerns.

## 2022-12-17 NOTE — Patient Instructions (Addendum)
SURGICAL WAITING ROOM VISITATION Patients having surgery or a procedure may have no more than 2 support people in the waiting area - these visitors may rotate in the visitor waiting room.   Due to an increase in RSV and influenza rates and associated hospitalizations, children ages 41 and under may not visit patients in [facility] hospitals. If the patient needs to stay at the hospital during part of their recovery, the visitor guidelines for inpatient rooms apply.  PRE-OP VISITATION  Pre-op nurse will coordinate an appropriate time for 1 support person to accompany the patient in pre-op.  This support person may not rotate.  This visitor will be contacted when the time is appropriate for the visitor to come back in the pre-op area.  Please refer to the [facility] website for the visitor guidelines for Inpatients (after your surgery is over and you are in a regular room).  You are not required to quarantine at this time prior to your surgery. However, you must do this: Hand Hygiene often Do NOT share personal items Notify your provider if you are in close contact with someone who has COVID or you develop fever 100.4 or greater, new onset of sneezing, cough, sore throat, shortness of breath or body aches.  If you test positive for Covid or have been in contact with anyone that has tested positive in the last 10 days please notify you surgeon.    Your procedure is scheduled on:  Friday  December 29, 2022  Report to [facility] Main Entrance: [place] entrance where the [employer] is available.   Report to admitting at: 10:45 AM  Call this number if you have any questions or problems the morning of surgery [phone]  DO NOT EAT OR DRINK ANYTHING AFTER MIDNIGHT THE NIGHT PRIOR TO YOUR SURGERY / PROCEDURE.   FOLLOW BOWEL PREP AND ANY ADDITIONAL PRE OP INSTRUCTIONS YOU RECEIVED FROM YOUR SURGEON'S OFFICE!!!   Oral Hygiene is also important to reduce your risk of infection.         Remember - BRUSH YOUR TEETH THE MORNING OF SURGERY WITH YOUR REGULAR TOOTHPASTE  Do NOT smoke after Midnight the night before surgery.  STOP TAKING all Vitamins, Herbs and supplements 1 week before your surgery.   Take ONLY these medicines the morning of surgery with A SIP OF WATER: Levothyroxine,    you may use your Eye Drops if needed.                    You may not have any metal on your body including hair pins, jewelry, and body piercing  Do not wear make-up, lotions, powders, perfumes or deodorant  Do not wear nail polish including gel and S&S, artificial / acrylic nails, or any other type of covering on natural nails including finger and toenails. If you have artificial nails, gel coating, etc., that needs to be removed by a nail salon, Please have this removed prior to surgery. Not doing so may mean that your surgery could be cancelled or delayed if the Surgeon or anesthesia staff feels like they are unable to monitor you safely.   Do not shave 48 hours prior to surgery to avoid nicks in your skin which may contribute to postoperative infections.    Contacts, Hearing Aids, dentures or bridgework may not be worn into surgery. DENTURES WILL BE REMOVED PRIOR TO SURGERY PLEASE DO NOT APPLY "Poly grip" OR ADHESIVES!!!  Patients discharged on the day of surgery will  not be allowed to drive home.  Someone NEEDS to stay with you for the first 24 hours after anesthesia.  Do not bring your home medications to the hospital. The Pharmacy will dispense medications listed on your medication list to you during your admission in the Hospital.  Special Instructions: Bring a copy of your healthcare power of attorney and living will documents the day of surgery, if you wish to have them scanned into your [facility]- EPIC  Please read over the following fact sheets you were given: IF YOU HAVE QUESTIONS ABOUT YOUR PRE-OP INSTRUCTIONS, PLEASE CALL [phone].   [facility] -  Preparing for Surgery Before surgery, you can play an important role.  Because skin is not sterile, your skin needs to be as free of germs as possible.  You can reduce the number of germs on your skin by washing with CHG (chlorahexidine gluconate) soap before surgery.  CHG is an antiseptic cleaner which kills germs and bonds with the skin to continue killing germs even after washing. Please DO NOT use if you have an allergy to CHG or antibacterial soaps.  If your skin becomes reddened/irritated stop using the CHG and inform your nurse when you arrive at Short Stay. Do not shave (including legs and underarms) for at least 48 hours prior to the first CHG shower.  You may shave your face/neck.  Please follow these instructions carefully:  1.  Shower with CHG Soap the night before surgery and the  morning of surgery.  2.  If you choose to wash your hair, wash your hair first as usual with your normal  shampoo.  3.  After you shampoo, rinse your hair and body thoroughly to remove the shampoo.                             4.  Use CHG as you would any other liquid soap.  You can apply chg directly to the skin and wash.  Gently with a scrungie or clean washcloth.  5.  Apply the CHG Soap to your body ONLY FROM THE NECK DOWN.   Do not use on face/ open                           Wound or open sores. Avoid contact with eyes, ears mouth and genitals (private parts).                       Wash face,  Genitals (private parts) with your normal soap.             6.  Wash thoroughly, paying special attention to the area where your  surgery  will be performed.  7.  Thoroughly rinse your body with warm water from the neck down.  8.  DO NOT shower/wash with your normal soap after using and rinsing off the CHG Soap.            9.  Pat yourself dry with a clean towel.            10.  Wear clean pajamas.            11.  Place clean sheets on your bed the night of your first shower and do not  sleep with pets.  ON THE DAY  OF SURGERY : Do not apply any lotions/deodorants the morning of surgery.  Please wear clean  clothes to the hospital/surgery center.    FAILURE TO FOLLOW THESE INSTRUCTIONS MAY RESULT IN THE CANCELLATION OF YOUR SURGERY  PATIENT SIGNATURE_________________________________  NURSE SIGNATURE__________________________________  ________________________________________________________________________

## 2022-12-19 ENCOUNTER — Other Ambulatory Visit: Payer: Self-pay

## 2022-12-19 ENCOUNTER — Encounter (HOSPITAL_COMMUNITY): Payer: Self-pay

## 2022-12-19 ENCOUNTER — Encounter (HOSPITAL_COMMUNITY)
Admission: RE | Admit: 2022-12-19 | Discharge: 2022-12-19 | Disposition: A | Discharge status: Home / Self Care | Payer: 59 | Source: Ambulatory Visit | Attending: Urology | Admitting: Urology

## 2022-12-19 DIAGNOSIS — Z01818 Encounter for other preprocedural examination: Secondary | ICD-10-CM

## 2022-12-19 DIAGNOSIS — N289 Disorder of kidney and ureter, unspecified: Secondary | ICD-10-CM

## 2022-12-19 HISTORY — DX: Blindness, one eye, unspecified eye: H54.40

## 2022-12-19 HISTORY — DX: Malignant (primary) neoplasm, unspecified: C80.1

## 2022-12-19 HISTORY — DX: Unspecified lack of expected normal physiological development in childhood: R62.50

## 2022-12-19 HISTORY — DX: Myoneural disorder, unspecified: G70.9

## 2022-12-19 HISTORY — DX: Chronic kidney disease, unspecified: N18.9

## 2022-12-19 HISTORY — DX: Unspecified osteoarthritis, unspecified site: M19.90

## 2022-12-21 ENCOUNTER — Encounter (HOSPITAL_COMMUNITY): Payer: Self-pay

## 2022-12-26 ENCOUNTER — Inpatient Hospital Stay (HOSPITAL_COMMUNITY): Admission: RE | Admit: 2022-12-26 | Payer: 59 | Source: Ambulatory Visit

## 2022-12-29 ENCOUNTER — Encounter (HOSPITAL_COMMUNITY): Payer: Self-pay | Admitting: Urology

## 2022-12-29 ENCOUNTER — Ambulatory Visit (HOSPITAL_COMMUNITY): Payer: 59

## 2022-12-29 ENCOUNTER — Encounter (HOSPITAL_COMMUNITY)
Admission: RE | Disposition: A | Discharge status: Home / Self Care | Payer: Self-pay | Source: Ambulatory Visit | Attending: Urology

## 2022-12-29 ENCOUNTER — Ambulatory Visit (HOSPITAL_BASED_OUTPATIENT_CLINIC_OR_DEPARTMENT_OTHER): Payer: 59 | Admitting: Anesthesiology

## 2022-12-29 ENCOUNTER — Ambulatory Visit (HOSPITAL_COMMUNITY): Payer: 59 | Admitting: Medical

## 2022-12-29 ENCOUNTER — Other Ambulatory Visit: Payer: Self-pay

## 2022-12-29 ENCOUNTER — Ambulatory Visit (HOSPITAL_COMMUNITY)
Admission: RE | Admit: 2022-12-29 | Discharge: 2022-12-29 | Disposition: A | Discharge status: Home / Self Care | Payer: 59 | Source: Ambulatory Visit | Attending: Urology | Admitting: Urology

## 2022-12-29 DIAGNOSIS — C672 Malignant neoplasm of lateral wall of bladder: Secondary | ICD-10-CM | POA: Diagnosis not present

## 2022-12-29 DIAGNOSIS — N3941 Urge incontinence: Secondary | ICD-10-CM | POA: Diagnosis not present

## 2022-12-29 DIAGNOSIS — C679 Malignant neoplasm of bladder, unspecified: Secondary | ICD-10-CM | POA: Diagnosis not present

## 2022-12-29 DIAGNOSIS — N3281 Overactive bladder: Secondary | ICD-10-CM | POA: Insufficient documentation

## 2022-12-29 DIAGNOSIS — N289 Disorder of kidney and ureter, unspecified: Secondary | ICD-10-CM

## 2022-12-29 DIAGNOSIS — E039 Hypothyroidism, unspecified: Secondary | ICD-10-CM | POA: Diagnosis not present

## 2022-12-29 DIAGNOSIS — Z01818 Encounter for other preprocedural examination: Secondary | ICD-10-CM

## 2022-12-29 HISTORY — PX: CYSTOSCOPY W/ RETROGRADES: SHX1426

## 2022-12-29 HISTORY — PX: BLADDER TUMOR EXCISION: SHX238

## 2022-12-29 LAB — BASIC METABOLIC PANEL
Anion gap: 9 (ref 5–15)
BUN: 16 mg/dL (ref 8–23)
CO2: 24 mmol/L (ref 22–32)
Calcium: 9.1 mg/dL (ref 8.9–10.3)
Chloride: 106 mmol/L (ref 98–111)
Creatinine, Ser: 0.94 mg/dL (ref 0.44–1.00)
GFR, Estimated: >60 mL/min (ref >60)
Glucose, Bld: 82 mg/dL (ref 70–99)
Potassium: 5.4 mmol/L — ABNORMAL HIGH (ref 3.5–5.1)
Sodium: 139 mmol/L (ref 135–145)

## 2022-12-29 LAB — CBC
HCT: 41.5 % (ref 36.0–46.0)
Hemoglobin: 12.7 g/dL (ref 12.0–15.0)
MCH: 29.5 pg (ref 26.0–34.0)
MCHC: 30.6 g/dL (ref 30.0–36.0)
MCV: 96.3 fL (ref 80.0–100.0)
Platelets: 286 10*3/uL (ref 150–400)
RBC: 4.31 MIL/uL (ref 3.87–5.11)
RDW: 14.1 % (ref 11.5–15.5)
WBC: 5.3 10*3/uL (ref 4.0–10.5)
nRBC: 0 % (ref 0.0–0.2)

## 2022-12-29 SURGERY — TURBT, WITH CHEMOTHERAPEUTIC AGENT INSTILLATION INTO BLADDER
Anesthesia: General

## 2022-12-29 MED ORDER — FENTANYL CITRATE (PF) 100 MCG/2ML IJ SOLN
INTRAMUSCULAR | Status: DC | PRN
  Administered 2022-12-29 (×2): 50 ug via INTRAVENOUS

## 2022-12-29 MED ORDER — SODIUM CHLORIDE 0.9 % IR SOLN
Status: DC | PRN
  Administered 2022-12-29: 3000 mL

## 2022-12-29 MED ORDER — ONDANSETRON HCL 4 MG/2ML IJ SOLN
INTRAMUSCULAR | Status: DC | PRN
  Administered 2022-12-29: 4 mg via INTRAVENOUS

## 2022-12-29 MED ORDER — SUGAMMADEX SODIUM 200 MG/2ML IV SOLN
INTRAVENOUS | Status: DC | PRN
  Administered 2022-12-29: 200 mg via INTRAVENOUS

## 2022-12-29 MED ORDER — PROPOFOL 10 MG/ML IV BOLUS
INTRAVENOUS | Status: AC
  Filled 2022-12-29: qty 20

## 2022-12-29 MED ORDER — ACETAMINOPHEN 160 MG/5ML PO SOLN: 1000.0000 mg | Freq: Once | ORAL | Status: DC | PRN

## 2022-12-29 MED ORDER — FENTANYL CITRATE (PF) 100 MCG/2ML IJ SOLN
INTRAMUSCULAR | Status: AC
  Filled 2022-12-29: qty 2

## 2022-12-29 MED ORDER — DEXAMETHASONE SODIUM PHOSPHATE 10 MG/ML IJ SOLN
INTRAMUSCULAR | Status: AC
  Filled 2022-12-29: qty 1

## 2022-12-29 MED ORDER — CHLORHEXIDINE GLUCONATE 0.12 % MT SOLN
15.0000 mL | Freq: Once | OROMUCOSAL | Status: AC
  Administered 2022-12-29: 15 mL via OROMUCOSAL

## 2022-12-29 MED ORDER — FENTANYL CITRATE PF 50 MCG/ML IJ SOSY: 25.0000 ug | PREFILLED_SYRINGE | INTRAMUSCULAR | Status: DC | PRN

## 2022-12-29 MED ORDER — ACETAMINOPHEN 10 MG/ML IV SOLN: 1000.0000 mg | Freq: Once | INTRAVENOUS | Status: DC | PRN

## 2022-12-29 MED ORDER — GEMCITABINE CHEMO FOR BLADDER INSTILLATION 2000 MG
2000.0000 mg | Freq: Once | INTRAVENOUS | Status: AC
  Administered 2022-12-29: 2000 mg via INTRAVESICAL
  Filled 2022-12-29: qty 2000

## 2022-12-29 MED ORDER — LACTATED RINGERS IV SOLN: INTRAVENOUS | Status: DC

## 2022-12-29 MED ORDER — ONDANSETRON HCL 4 MG/2ML IJ SOLN
INTRAMUSCULAR | Status: AC
  Filled 2022-12-29: qty 2

## 2022-12-29 MED ORDER — LIDOCAINE 2% (20 MG/ML) 5 ML SYRINGE
INTRAMUSCULAR | Status: DC | PRN
  Administered 2022-12-29: 100 mg via INTRAVENOUS

## 2022-12-29 MED ORDER — MIDAZOLAM HCL 2 MG/2ML IJ SOLN
INTRAMUSCULAR | Status: AC
  Filled 2022-12-29: qty 2

## 2022-12-29 MED ORDER — ACETAMINOPHEN 500 MG PO TABS: 1000.0000 mg | ORAL_TABLET | Freq: Once | ORAL | Status: DC | PRN

## 2022-12-29 MED ORDER — IOHEXOL 300 MG/ML  SOLN
INTRAMUSCULAR | Status: DC | PRN
  Administered 2022-12-29: 10 mL

## 2022-12-29 MED ORDER — DEXAMETHASONE SODIUM PHOSPHATE 10 MG/ML IJ SOLN
INTRAMUSCULAR | Status: DC | PRN
  Administered 2022-12-29: 5 mg via INTRAVENOUS

## 2022-12-29 MED ORDER — CEFAZOLIN SODIUM-DEXTROSE 2-4 GM/100ML-% IV SOLN
2.0000 g | INTRAVENOUS | Status: AC
  Administered 2022-12-29: 2 g via INTRAVENOUS
  Filled 2022-12-29: qty 100

## 2022-12-29 MED ORDER — ORAL CARE MOUTH RINSE: 15.0000 mL | Freq: Once | OROMUCOSAL | Status: AC

## 2022-12-29 MED ORDER — LIDOCAINE HCL (PF) 2 % IJ SOLN
INTRAMUSCULAR | Status: AC
  Filled 2022-12-29: qty 5

## 2022-12-29 MED ORDER — MIDAZOLAM HCL 5 MG/5ML IJ SOLN
INTRAMUSCULAR | Status: DC | PRN
  Administered 2022-12-29: 2 mg via INTRAVENOUS

## 2022-12-29 MED ORDER — ROCURONIUM BROMIDE 10 MG/ML (PF) SYRINGE
PREFILLED_SYRINGE | INTRAVENOUS | Status: DC | PRN
  Administered 2022-12-29: 60 mg via INTRAVENOUS

## 2022-12-29 MED ORDER — PROPOFOL 10 MG/ML IV BOLUS
INTRAVENOUS | Status: DC | PRN
  Administered 2022-12-29: 160 mg via INTRAVENOUS
  Administered 2022-12-29: 40 mg via INTRAVENOUS

## 2022-12-29 SURGICAL SUPPLY — 9 items
BAG URO CATCHER STRL LF (MISCELLANEOUS) ×2 IMPLANT
CATH URETL OPEN END 6FR 70 (CATHETERS) ×2 IMPLANT
CLOTH BEACON ORANGE TIMEOUT ST (SAFETY) ×2 IMPLANT
GLOVE SURG LX STRL 7.5 STRW (GLOVE) ×2 IMPLANT
GUIDEWIRE STR DUAL SENSOR (WIRE) IMPLANT
KIT TURNOVER KIT A (KITS) IMPLANT
NS IRRIG 1000ML POUR BTL (IV SOLUTION) IMPLANT
PACK CYSTO (CUSTOM PROCEDURE TRAY) ×2 IMPLANT
TUBING CONNECTING 10 (TUBING) ×2 IMPLANT

## 2022-12-29 NOTE — H&P (Signed)
CC/HPI: Patient was being assessed for incontinence and underwent a cystoscopy. She was incidentally found to have the following:   patient had superficial 1 cm papillary tumor along left lateral wall cephalad to the left ureter. She had mild bladder trabeculation. No evidence of carcinoma situ. No cystitis. Trigone normal.   Given this finding, she underwent a CT IVP. This revealed some mildly prominent pelvic lymph nodes side the bladder for which attention the follow-up was recommended. There was some bladder wall thickening correlating to the area seen on cystoscopy.   10/28/2020  Patient is status post TURBT with instillation of gemcitabine. 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 Detrusor was present and not involved. She has been taking Myrbetriq and she has been drier at night.   01/25/2021  Patient presents today for surveillance cystoscopy. She continues on Myrbetriq. This is working well for her overactive bladder. She is having no nocturnal enuresis.   11/10/2022  Patient presents for surveillance cystoscopy. Continues on Myrbetriq with good results.   12/29/2022 Patient presents today for cystoscopy with transurethral resection of bladder tumor with instillation of gemcitabine, bilateral retrograde pyelogram.   ALLERGIES: No Allergies    MEDICATIONS: Eucerin cream  Levothyroxine Sodium 50 mcg capsule  Refresh Tears  Restasis  Seroquel  Vitamin D3     GU PSH: Cystoscopy - 10/20/2021, 01/25/2021, 2022 Cystoscopy TURBT 2-5 cm - 2022 Locm 300-399Mg /Ml Iodine,1Ml - 2022     NON-GU PSH: No Non-GU PSH        GU PMH: Bladder Cancer Lateral - 10/20/2021, - 01/25/2021, - 2022 Nocturnal Enuresis - 2022, - 2022, - 2022 Bladder tumor/neoplasm - 2022 Incontinence w/o Sensation - 2022, - 2022, - 2022 Urge incontinence - 2022 Urinary Frequency - 2022    NON-GU PMH: No Non-GU PMH    FAMILY HISTORY: No Family History    SOCIAL HISTORY: Marital Status:  Single Current Smoking Status: Patient has never smoked.  <DIV'  Tobacco Use Assessment Completed:  Used Tobacco in last 30 days?   Drinks 1 caffeinated drink per day. Patient's occupation is/was disabled.    REVIEW OF SYSTEMS:     GU Review Female:  Patient denies frequent urination, hard to postpone urination, burning /pain with urination, get up at night to urinate, leakage of urine, stream starts and stops, trouble starting your stream, have to strain to urinate, and being pregnant.    Gastrointestinal (Upper):  Patient denies nausea, vomiting, and indigestion/ heartburn.    Gastrointestinal (Lower):  Patient denies diarrhea and constipation.    Constitutional:  Patient denies fever, night sweats, weight loss, and fatigue.    Skin:  Patient denies skin rash/ lesion and itching.    Eyes:  Patient denies blurred vision and double vision.    Ears/ Nose/ Throat:  Patient denies sore throat and sinus problems.    Hematologic/Lymphatic:  Patient denies swollen glands and easy bruising.    Cardiovascular:  Patient denies leg swelling and chest pains.    Respiratory:  Patient denies cough and shortness of breath.    Endocrine:  Patient denies excessive thirst.    Musculoskeletal:  Patient denies back pain and joint pain.    Neurological:  Patient denies headaches and dizziness.    Psychologic:  Patient denies depression and anxiety.    VITAL SIGNS: None     MULTI-SYSTEM PHYSICAL EXAMINATION:      Constitutional: Well-nourished. No physical deformities. Normally developed. Good grooming.     Gastrointestinal: No mass, no tenderness, no rigidity, non  obese abdomen.     Eyes: Normal conjunctivae. Normal eyelids.                ASSESSMENT:     ICD-10 Details  1 GU:  Bladder Cancer Lateral - C67.2 Chronic, Worsening  2  Urge incontinence - N39.41 Chronic, Stable   PLAN:   Proceed with transurethral resection of bladder tumor with instillation of gemcitabine and bilateral retrograde  pyelogram.

## 2022-12-29 NOTE — Anesthesia Postprocedure Evaluation (Signed)
Anesthesia Post Note  Patient: TROYE SOLL  Procedure(s) Performed: TRANSURETHRAL RESECTION OF BLADDER TUMOR (TURBT) with GEMCITABINE CYSTOSCOPY WITH BILATERAL RETROGRADE PYELOGRAM (Bilateral)     Patient location during evaluation: PACU Anesthesia Type: General Level of consciousness: awake and alert Pain management: pain level controlled Vital Signs Assessment: post-procedure vital signs reviewed and stable Respiratory status: spontaneous breathing, nonlabored ventilation, respiratory function stable and patient connected to nasal cannula oxygen Cardiovascular status: blood pressure returned to baseline and stable Postop Assessment: no apparent nausea or vomiting Anesthetic complications: yes   Encounter Notable Events  Notable Event Outcome Phase Comment  Difficult to intubate - expected  Intraprocedure Filed from anesthesia note documentation.    Last Vitals:  Vitals:   12/29/22 1439 12/29/22 1445  BP: (!) 189/95 (!) 174/93  Pulse: 68   Resp: 11 15  Temp: (!) 36.4 C   SpO2: 100% 99%    Last Pain:  Vitals:   12/29/22 1445  TempSrc:   PainSc: Asleep                 Trevor Iha

## 2022-12-29 NOTE — Anesthesia Procedure Notes (Signed)
Procedure Name: Intubation Date/Time: 12/29/2022 1:53 PM  Performed by: Vanessa , CRNAPre-anesthesia Checklist: Emergency Drugs available, Suction available, Patient identified and Patient being monitored Patient Re-evaluated:Patient Re-evaluated prior to induction Oxygen Delivery Method: Circle system utilized Preoxygenation: Pre-oxygenation with 100% oxygen Induction Type: IV induction Ventilation: Mask ventilation without difficulty Laryngoscope Size: Glidescope and 3 Grade View: Grade I Tube type: Oral Tube size: 7.0 mm Number of attempts: 1 Airway Equipment and Method: Video-laryngoscopy Placement Confirmation: ETT inserted through vocal cords under direct vision, positive ETCO2 and breath sounds checked- equal and bilateral Secured at: 21 cm Tube secured with: Tape Dental Injury: Teeth and Oropharynx as per pre-operative assessment  Difficulty Due To: Difficulty was anticipated, Difficult Airway- due to large tongue, Difficult Airway- due to reduced neck mobility and Difficult Airway- due to limited oral opening

## 2022-12-29 NOTE — Discharge Instructions (Signed)
Transurethral Resection of Bladder Tumor (TURBT) or Bladder Biopsy ° ° °Definition: ° Transurethral Resection of the Bladder Tumor is a surgical procedure used to diagnose and remove tumors within the bladder. TURBT is the most common treatment for early stage bladder cancer. ° °General instructions: °   ° Your recent bladder surgery requires very little post hospital care but some definite precautions. ° °Despite the fact that no skin incisions were used, the area around the bladder incisions are raw and covered with scabs to promote healing and prevent bleeding. Certain precautions are needed to insure that the scabs are not disturbed over the next 2-4 weeks while the healing proceeds. ° °Because the raw surface inside your bladder and the irritating effects of urine you may expect frequency of urination and/or urgency (a stronger desire to urinate) and perhaps even getting up at night more often. This will usually resolve or improve slowly over the healing period. You may see some blood in your urine over the first 6 weeks. Do not be alarmed, even if the urine was clear for a while. Get off your feet and drink lots of fluids until clearing occurs. If you start to pass clots or don't improve call us. ° °Diet: ° °You may return to your normal diet immediately. Because of the raw surface of your bladder, alcohol, spicy foods, foods high in acid and drinks with caffeine may cause irritation or frequency and should be used in moderation. To keep your urine flowing freely and avoid constipation, drink plenty of fluids during the day (8-10 glasses). Tip: Avoid cranberry juice because it is very acidic. ° °Activity: ° °Your physical activity doesn't need to be restricted. However, if you are very active, you may see some blood in the urine. We suggest that you reduce your activity under the circumstances until the bleeding has stopped. ° °Bowels: ° °It is important to keep your bowels regular during the postoperative  period. Straining with bowel movements can cause bleeding. A bowel movement every other day is reasonable. Use a mild laxative if needed, such as milk of magnesia 2-3 tablespoons, or 2 Dulcolax tablets. Call if you continue to have problems. If you had been taking narcotics for pain, before, during or after your surgery, you may be constipated. Take a laxative if necessary. ° ° ° °Medication: ° °You should resume your pre-surgery medications unless told not to. In addition you may be given an antibiotic to prevent or treat infection. Antibiotics are not always necessary. All medication should be taken as prescribed until the bottles are finished unless you are having an unusual reaction to one of the drugs. ° ° ° ° ° °

## 2022-12-29 NOTE — Anesthesia Preprocedure Evaluation (Addendum)
Anesthesia Evaluation  Patient identified by MRN, date of birth, ID band Patient awake    Reviewed: Allergy & Precautions, NPO status , Patient's Chart, lab work & pertinent test results  History of Anesthesia Complications Negative for: history of anesthetic complications  Airway Mallampati: II   Neck ROM: Full    Dental  (+) Dental Advisory Given   Pulmonary neg pulmonary ROS   Pulmonary exam normal        Cardiovascular negative cardio ROS Normal cardiovascular exam     Neuro/Psych Developmental delay    GI/Hepatic negative GI ROS, Neg liver ROS,,,  Endo/Other  Hypothyroidism    Renal/GU Renal InsufficiencyRenal disease  negative genitourinary   Musculoskeletal  (+) Arthritis ,    Abdominal   Peds  Hematology negative hematology ROS (+)   Anesthesia Other Findings Day of surgery medications reviewed with patient.  Reproductive/Obstetrics negative OB ROS                             Anesthesia Physical Anesthesia Plan  ASA: 2  Anesthesia Plan: General   Post-op Pain Management: Ofirmev IV (intra-op)*   Induction: Intravenous  PONV Risk Score and Plan: 3 and Treatment may vary due to age or medical condition, Midazolam, Dexamethasone and Ondansetron  Airway Management Planned: Oral ETT and Video Laryngoscope Planned  Additional Equipment: None  Intra-op Plan:   Post-operative Plan: Extubation in OR  Informed Consent: I have reviewed the patients History and Physical, chart, labs and discussed the procedure including the risks, benefits and alternatives for the proposed anesthesia with the patient or authorized representative who has indicated his/her understanding and acceptance.     Dental advisory given and Consent reviewed with POA  Plan Discussed with: CRNA  Anesthesia Plan Comments:         Anesthesia Quick Evaluation

## 2022-12-29 NOTE — Transfer of Care (Signed)
Immediate Anesthesia Transfer of Care Note  Patient: Abigail Webster  Procedure(s) Performed: TRANSURETHRAL RESECTION OF BLADDER TUMOR (TURBT) with GEMCITABINE CYSTOSCOPY WITH BILATERAL RETROGRADE PYELOGRAM (Bilateral)  Patient Location: PACU  Anesthesia Type:General  Level of Consciousness: drowsy  Airway & Oxygen Therapy: Patient Spontanous Breathing and Patient connected to face mask  Post-op Assessment: Report given to RN and Post -op Vital signs reviewed and stable  Post vital signs: Reviewed and stable  Last Vitals:  Vitals Value Taken Time  BP    Temp    Pulse 68 12/29/22 1438  Resp 11 12/29/22 1438  SpO2 100 % 12/29/22 1438  Vitals shown include unfiled device data.  Last Pain:  Vitals:   12/29/22 1100  TempSrc: Oral         Complications:  Encounter Notable Events  Notable Event Outcome Phase Comment  Difficult to intubate - expected  Intraprocedure Filed from anesthesia note documentation.

## 2022-12-29 NOTE — Op Note (Signed)
Operative Note  Preoperative diagnosis:  1.  Bladder tumor  Postoperative diagnosis: 1.  Bladder tumor--small  Procedure(s): 1.  Transurethral resection of bladder tumor--small 2.  Bilateral retrograde pyelogram 3.  Intravesical instillation of gemcitabine  Surgeon: Modena Slater, MD  Assistants: None  Anesthesia: General  Complications: None immediate  EBL: Minimal  Specimens: 1.  Bladder tumor  Drains/Catheters: 1.  16 French Foley catheter  Intraoperative findings: 1.  Normal urethra 2.  Bladder mucosa with an approximately 1 cm superficial appearing papillary bladder tumor on the left lateral wall completely resected.  No other bladder tumors. 3.  Bilateral retrograde pyelogram without any filling defect or hydronephrosis.  Indication: 62 year old female with a bladder tumor presents for previously mentioned operation.  Description of procedure:  The patient was identified and consent was obtained.  The patient was taken to the operating room and placed in the supine position.  The patient was placed under general anesthesia.  Perioperative antibiotics were administered.  The patient was placed in dorsal lithotomy.  Patient was prepped and draped in a standard sterile fashion and a timeout was performed.  A 21 French rigid cystoscope was advanced into the urethra and into the bladder.  Complete cystoscopy was performed with the findings noted above.  The left ureteral orifice was intubated with an open-ended ureteral catheter and a retrograde pyelogram was performed with no abnormal findings.  Same was performed on the right again with no abnormal findings.  I withdrew the scope and advanced a 41 French resectoscope with a visual obturator in place into the urethra and into the bladder.  I exchanged for the bipolar working element and resected the tumor and collected it for specimen.  I fulgurated the resection bed.  There is no evidence of any perforation and no active  bleeding.  I withdrew the scope and placed a Foley catheter.  This concluded the operation.  Patient tolerated the procedure well was stable postoperatively.  In the PACU, I instilled gemcitabine into the bladder.  This remained approximately 1 hour prior to proper disposal.  Plan: Follow-up in 1 week for pathology review

## 2022-12-30 ENCOUNTER — Encounter (HOSPITAL_COMMUNITY): Payer: Self-pay | Admitting: Urology

## 2023-01-02 NOTE — Addendum Note (Signed)
Addendum  created 01/02/23 1824 by Kaylyn Layer, MD   Intraprocedure Staff edited

## 2023-01-04 LAB — SURGICAL PATHOLOGY

## 2023-01-10 ENCOUNTER — Ambulatory Visit (INDEPENDENT_AMBULATORY_CARE_PROVIDER_SITE_OTHER): Payer: 59 | Admitting: Family Medicine

## 2023-01-10 ENCOUNTER — Encounter: Payer: Self-pay | Admitting: Family Medicine

## 2023-01-10 VITALS — BP 122/84 | HR 70 | Temp 97.3°F | Ht 60.0 in | Wt 178.8 lb

## 2023-01-10 DIAGNOSIS — F79 Unspecified intellectual disabilities: Secondary | ICD-10-CM | POA: Diagnosis not present

## 2023-01-10 DIAGNOSIS — R5381 Other malaise: Secondary | ICD-10-CM | POA: Diagnosis not present

## 2023-01-10 DIAGNOSIS — R0609 Other forms of dyspnea: Secondary | ICD-10-CM

## 2023-01-10 NOTE — Progress Notes (Signed)
New Patient Office Visit  Subjective    Patient ID: Abigail Webster, female    DOB: [birth year]  Age: 62 y.o. MRN: [id number]  CC:  Chief Complaint  Patient presents with   Fatigue    Fatigue and wheezing with short distant walks.     HPI [name] presents to establish care. Encounter Diagnoses  Name Primary?   DOE (dyspnea on exertion) Yes   Mentally challenged    Debilitated    Has been experiencing dyspnea on exertion with walking for any distance.  There is been no chest pain, nausea vomiting or diaphoresis.  She has no history of asthma.  She has never smoked.  She is here with her ongoing caregiver who feels as though she may need to be transferred to a more advanced facility.  She will need an IQ evaluation as part of the evaluation process for the advanced care facility.  Outpatient Encounter Medications as of 01/10/2023  Medication Sig   Cholecalciferol (D3 SUPER STRENGTH) 50 MCG (2000 UT) CAPS Take 1 capsule (2,000 Units total) by mouth daily.   Emollient (EUCERIN EX) Apply 1 application topically daily.   EPINEPHrine 0.3 mg/0.3 mL IJ SOAJ injection Inject 0.3 mg into the muscle as needed for anaphylaxis.   gabapentin (NEURONTIN) 100 MG capsule Take 1 capsule (100 mg total) by mouth at bedtime. As needed for leg cramps   levothyroxine (SYNTHROID) 50 MCG tablet TAKE (1) TABLET BY MOUTH ONCE DAILY IN THE MORNING ON A FASTING STOMACH 1 HOUR BEFORE EATING   MYRBETRIQ 50 MG TB24 tablet Take 50 mg by mouth daily.   QUEtiapine (SEROQUEL) 100 MG tablet TAKE ONE TABLET BY MOUTH AT BEDTIME   REFRESH TEARS 0.5 % SOLN Place 1 drop into both eyes daily.   RESTASIS 0.05 % ophthalmic emulsion Place 1 drop into both eyes 2 (two) times daily.   Scar Treatment Products [facility] ADVANCED SCAR GEL) GEL Cover healed wounds daily with a thin coat.   silver sulfADIAZINE (SILVADENE) 1 % cream Apply 1 Application topically daily.   [DISCONTINUED] predniSONE (DELTASONE) 20 MG tablet Take 2  tablets (40 mg total) by mouth daily with breakfast.   No facility-administered encounter medications on file as of 01/10/2023.    Past Medical History:  Diagnosis Date   Arthritis    Blind right eye    Cancer (HCC)    Bladder   Chronic kidney disease    CKD 3   Developmental delay disorder    Hypothyroidism    Mentally challenged    Neuromuscular disorder (HCC)    restless leg syndrome   Thyroid disease     Past Surgical History:  Procedure Laterality Date   ABDOMINAL HYSTERECTOMY     BLADDER TUMOR EXCISION  12/29/2022   Bilteral Cystectomy - [facility]   CYSTOSCOPY W/ RETROGRADES Bilateral 12/29/2022   Procedure: CYSTOSCOPY WITH BILATERAL RETROGRADE PYELOGRAM;  Surgeon: Crista Elliot, MD;  Location: [facility];  Service: Urology;  Laterality: Bilateral;  45 MINS FRO CASE   FOOT SURGERY     TRANSURETHRAL RESECTION OF BLADDER TUMOR WITH MITOMYCIN-C N/A 10/18/2020   Procedure: TRANSURETHRAL RESECTION OF BLADDER TUMOR WITH GEMCITABINE;  Surgeon: Crista Elliot, MD;  Location: [facility];  Service: Urology;  Laterality: N/A;    Family History  Problem Relation Age of Onset   Pancreatic cancer Mother    Hypertension Mother    Hypothyroidism Mother    Colon cancer Father    Hypertension  Father     Social History   Socioeconomic History   Marital status: Single    Spouse name: Not on file   Number of children: Not on file   Years of education: Not on file   Highest education level: Not on file  Occupational History   Not on file  Tobacco Use   Smoking status: Never    Passive exposure: Never   Smokeless tobacco: Never  Vaping Use   Vaping status: Never Used  Substance and Sexual Activity   Alcohol use: Never   Drug use: Never   Sexual activity: Never  Other Topics Concern   Not on file  Social History Narrative   Not on file   Social Determinants of Health   Financial Resource Strain: Low Risk  (03/13/2022)   Overall Financial Resource Strain  (CARDIA)    Difficulty of Paying Living Expenses: Not hard at all  Food Insecurity: No Food Insecurity (03/13/2022)   Hunger Vital Sign    Worried About Running Out of Food in the Last Year: Never true    Ran Out of Food in the Last Year: Never true  Transportation Needs: No Transportation Needs (03/13/2022)   PRAPARE - Administrator, Civil Service (Medical): No    Lack of Transportation (Non-Medical): No  Physical Activity: Insufficiently Active (03/13/2022)   Exercise Vital Sign    Days of Exercise per Week: 2 days    Minutes of Exercise per Session: 30 min  Stress: No Stress Concern Present (03/13/2022)   [employer] of Occupational Health - Occupational Stress Questionnaire    Feeling of Stress : Not at all  Social Connections: Moderately Integrated (03/09/2021)   Social Connection and Isolation Panel [NHANES]    Frequency of Communication with Friends and Family: Twice a week    Frequency of Social Gatherings with Friends and Family: Twice a week    Attends Religious Services: More than 4 times per year    Active Member of [employer] or Organizations: Yes    Attends Banker Meetings: More than 4 times per year    Marital Status: Never married  Intimate Partner Violence: Not At Risk (03/09/2021)   Humiliation, Afraid, Rape, and Kick questionnaire    Fear of Current or Ex-Partner: No    Emotionally Abused: No    Physically Abused: No    Sexually Abused: No    Review of Systems  Constitutional: Negative.  Negative for chills, diaphoresis and fever.  HENT: Negative.    Eyes:  Negative for blurred vision, discharge and redness.  Respiratory:  Positive for shortness of breath. Negative for cough, hemoptysis, sputum production and wheezing.   Cardiovascular: Negative.  Negative for chest pain.  Gastrointestinal:  Negative for abdominal pain, heartburn and nausea.  Genitourinary: Negative.   Musculoskeletal: Negative.  Negative for myalgias.  Skin:   Negative for rash.  Neurological:  Negative for tingling, loss of consciousness and weakness.  Endo/Heme/Allergies:  Negative for polydipsia.        Objective    BP 122/84   Pulse 70   Temp (!) 97.3 F (36.3 C)   Ht 5' (1.524 m)   Wt 178 lb 12.8 oz (81.1 kg)   SpO2 97%   BMI 34.92 kg/m   Physical Exam Constitutional:      General: She is not in acute distress.    Appearance: Normal appearance. She is not ill-appearing, toxic-appearing or diaphoretic.  HENT:     Head: Normocephalic and  atraumatic.     Right Ear: External ear normal.     Left Ear: External ear normal.     Mouth/Throat:     Mouth: Mucous membranes are moist.     Pharynx: Oropharynx is clear. No oropharyngeal exudate or posterior oropharyngeal erythema.  Eyes:     General: No scleral icterus.       Right eye: No discharge.        Left eye: No discharge.     Conjunctiva/sclera: Conjunctivae normal.  Cardiovascular:     Rate and Rhythm: Normal rate and regular rhythm.  Pulmonary:     Effort: Pulmonary effort is normal. No respiratory distress.     Breath sounds: Normal breath sounds. No wheezing, rhonchi or rales.  Abdominal:     General: Bowel sounds are normal.  Musculoskeletal:     Cervical back: No rigidity or tenderness.  Skin:    General: Skin is warm and dry.  Neurological:     Mental Status: She is alert and oriented to person, place, and time.  Psychiatric:        Mood and Affect: Mood normal.        Behavior: Behavior normal.         Assessment & Plan:   DOE (dyspnea on exertion) -     EKG 12-Lead -     Ambulatory referral to Home Health -     Ambulatory referral to Pulmonology -     DG Chest 2 View; Future  Mentally challenged -     Ambulatory referral to Psychology  Debilitated -     Ambulatory referral to Home Health     Return in about 3 months (around 04/11/2023).   EKG today showed normal sinus rhythm with nonspecific T wave changes.  There is no ST segment  elevation or T wave inversion.  Mliss Sax, MD

## 2023-01-12 ENCOUNTER — Ambulatory Visit (INDEPENDENT_AMBULATORY_CARE_PROVIDER_SITE_OTHER)
Admission: RE | Admit: 2023-01-12 | Discharge: 2023-01-12 | Disposition: A | Discharge status: Home / Self Care | Payer: 59 | Source: Ambulatory Visit | Attending: Family Medicine | Admitting: Family Medicine

## 2023-01-12 DIAGNOSIS — R0609 Other forms of dyspnea: Secondary | ICD-10-CM

## 2023-01-18 NOTE — Addendum Note (Signed)
Addended by: Nadene Rubins A on: 01/18/2023 01:09 PM   Modules accepted: Orders

## 2023-01-29 ENCOUNTER — Telehealth: Payer: Self-pay | Admitting: Family Medicine

## 2023-01-29 NOTE — Telephone Encounter (Signed)
Laticha called in from pt's group home needing to speak with Dr Doreene Burke concerning silver sulfADIAZINE (SILVADENE) 1 % cream [[id number]]. She would like this script taken off his med list. Her # [phone].

## 2023-02-05 ENCOUNTER — Ambulatory Visit (INDEPENDENT_AMBULATORY_CARE_PROVIDER_SITE_OTHER): Payer: 59

## 2023-02-05 DIAGNOSIS — Z0000 Encounter for general adult medical examination without abnormal findings: Secondary | ICD-10-CM | POA: Diagnosis not present

## 2023-02-05 NOTE — Progress Notes (Signed)
Subjective:   Abigail Webster is a 62 y.o. female who presents for Medicare Annual (Subsequent) preventive examination.  Visit Complete: Virtual  I connected with  Edgardo Roys on 02/05/23 by a audio enabled telemedicine application and verified that I am speaking with the correct person using two identifiers. Caregiver Oneida Alar was also on the call.   Patient Location: Home  Provider Location: Office/Clinic  I discussed the limitations of evaluation and management by telemedicine. The patient expressed understanding and agreed to proceed.  Because this visit was a virtual/telehealth visit, some criteria may be missing or patient reported. Any vitals not documented were not able to be obtained and vitals that have been documented are patient reported.   Cardiac Risk Factors include: none     Objective:    Today's Vitals   02/05/23 1541  PainSc: 4    There is no height or weight on file to calculate BMI.     02/05/2023    3:50 PM 12/29/2022   11:18 AM 03/13/2022    8:18 AM 03/09/2021    3:15 PM 10/14/2020    8:41 AM 11/13/2019   11:23 AM  Advanced Directives  Does Patient Have a Medical Advance Directive? Yes No Yes No No Yes  Type of Forensic scientist of [employer] Power of [place];Living will  Copy of Healthcare Power of Attorney in Chart?      No - copy requested  Would patient like information on creating a medical advance directive?  No - Patient declined  No - Patient declined      Current Medications (verified) Outpatient Encounter Medications as of 02/05/2023  Medication Sig   Cholecalciferol (D3 SUPER STRENGTH) 50 MCG (2000 UT) CAPS Take 1 capsule (2,000 Units total) by mouth daily.   Emollient (EUCERIN EX) Apply 1 application topically daily.   EPINEPHrine 0.3 mg/0.3 mL IJ SOAJ injection Inject 0.3 mg into the muscle as needed for anaphylaxis.   gabapentin (NEURONTIN) 100 MG capsule Take 1 capsule  (100 mg total) by mouth at bedtime. As needed for leg cramps   levothyroxine (SYNTHROID) 50 MCG tablet TAKE (1) TABLET BY MOUTH ONCE DAILY IN THE MORNING ON A FASTING STOMACH 1 HOUR BEFORE EATING   MYRBETRIQ 50 MG TB24 tablet Take 50 mg by mouth daily.   QUEtiapine (SEROQUEL) 100 MG tablet TAKE ONE TABLET BY MOUTH AT BEDTIME   REFRESH TEARS 0.5 % SOLN Place 1 drop into both eyes daily.   RESTASIS 0.05 % ophthalmic emulsion Place 1 drop into both eyes 2 (two) times daily.   Scar Treatment Products [facility] ADVANCED SCAR GEL) GEL Cover healed wounds daily with a thin coat.   No facility-administered encounter medications on file as of 02/05/2023.    Allergies (verified) Patient has no known allergies.   History: Past Medical History:  Diagnosis Date   Arthritis    Blind right eye    Cancer (HCC)    Bladder   Chronic kidney disease    CKD 3   Developmental delay disorder    Hypothyroidism    Mentally challenged    Neuromuscular disorder (HCC)    restless leg syndrome   Thyroid disease    Past Surgical History:  Procedure Laterality Date   ABDOMINAL HYSTERECTOMY     BLADDER TUMOR EXCISION  12/29/2022   Bilteral Cystectomy - [facility]   CYSTOSCOPY W/ RETROGRADES Bilateral 12/29/2022   Procedure: CYSTOSCOPY WITH BILATERAL RETROGRADE PYELOGRAM;  Surgeon: Crista Elliot, MD;  Location: [facility];  Service: Urology;  Laterality: Bilateral;  45 MINS FRO CASE   FOOT SURGERY     TRANSURETHRAL RESECTION OF BLADDER TUMOR WITH MITOMYCIN-C N/A 10/18/2020   Procedure: TRANSURETHRAL RESECTION OF BLADDER TUMOR WITH GEMCITABINE;  Surgeon: Crista Elliot, MD;  Location: [facility];  Service: Urology;  Laterality: N/A;   Family History  Problem Relation Age of Onset   Pancreatic cancer Mother    Hypertension Mother    Hypothyroidism Mother    Colon cancer Father    Hypertension Father    Social History   Socioeconomic History   Marital status: Single    Spouse name: Not on file    Number of children: Not on file   Years of education: Not on file   Highest education level: Not on file  Occupational History   Not on file  Tobacco Use   Smoking status: Never    Passive exposure: Never   Smokeless tobacco: Never  Vaping Use   Vaping status: Never Used  Substance and Sexual Activity   Alcohol use: Never   Drug use: Never   Sexual activity: Never  Other Topics Concern   Not on file  Social History Narrative   Not on file   Social Determinants of Health   Financial Resource Strain: Low Risk  (02/05/2023)   Overall Financial Resource Strain (CARDIA)    Difficulty of Paying Living Expenses: Not hard at all  Food Insecurity: No Food Insecurity (02/05/2023)   Hunger Vital Sign    Worried About Running Out of Food in the Last Year: Never true    Ran Out of Food in the Last Year: Never true  Transportation Needs: No Transportation Needs (02/05/2023)   PRAPARE - Administrator, Civil Service (Medical): No    Lack of Transportation (Non-Medical): No  Physical Activity: Inactive (02/05/2023)   Exercise Vital Sign    Days of Exercise per Week: 0 days    Minutes of Exercise per Session: 0 min  Stress: Stress Concern Present (02/05/2023)   [employer] of Occupational Health - Occupational Stress Questionnaire    Feeling of Stress : To some extent  Social Connections: Patient Unable To Answer (02/05/2023)   Social Connection and Isolation Panel [NHANES]    Frequency of Communication with Friends and Family: Patient unable to answer    Frequency of Social Gatherings with Friends and Family: Patient unable to answer    Attends Religious Services: Patient unable to answer    Active Member of Clubs or Organizations: Patient unable to answer    Attends Banker Meetings: Patient unable to answer    Marital Status: Patient unable to answer    Tobacco Counseling Counseling given: Not Answered   Clinical Intake:  Pre-visit preparation  completed: Yes  Pain : 0-10 Pain Score: 4  Pain Type: Chronic pain Pain Location: Back Pain Orientation: Lower Pain Radiating Towards: right hip Pain Descriptors / Indicators: Aching Pain Onset: More than a month ago Pain Frequency: Constant     Nutritional Risks: None Diabetes: No  How often do you need to have someone help you when you read instructions, pamphlets, or other written materials from your doctor or pharmacy?: 5 - Always  Interpreter Needed?: No  Information entered by :: NAllen LPN   Activities of Daily Living    02/05/2023    3:43 PM 12/19/2022   10:28 AM  In your present state  of health, do you have any difficulty performing the following activities:  Hearing? 0   Vision? 1   Comment blind right eye   Difficulty concentrating or making decisions? 1 1  Walking or climbing stairs? 1   Dressing or bathing? 1 1  Doing errands, shopping? 1   Preparing Food and eating ? Y   Using the Toilet? Y   In the past six months, have you accidently leaked urine? Y   Comment incontinent, wears a pull up   Do you have problems with loss of bowel control? N   Managing your Medications? Y   Managing your Finances? Y   Housekeeping or managing your Housekeeping? Y     Patient Care Team: Mliss Sax, MD as PCP - General (Family Medicine)  Indicate any recent Medical Services you may have received from other than [facility] providers in the past year (date may be approximate).     Assessment:   This is a routine wellness examination for [employer].  Hearing/Vision screen Hearing Screening - Comments:: Denies hearing issues Vision Screening - Comments:: Blind in right eye   Goals Addressed             This Visit's Progress    Patient Stated       02/05/2023, would like to get her placed in nursing home or other facility       Depression Screen    02/05/2023    3:52 PM 05/18/2022    3:34 PM 03/13/2022    2:20 PM 03/13/2022    8:19 AM 03/06/2022     1:10 PM 02/09/2022    1:46 PM 03/09/2021    3:14 PM  PHQ 2/9 Scores  PHQ - 2 Score  0 0 0 0 0 0  Exception Documentation Medical reason          Fall Risk    02/05/2023    3:51 PM 05/18/2022    3:34 PM 03/13/2022    2:20 PM 03/13/2022    8:19 AM 03/06/2022    1:09 PM  Fall Risk   Falls in the past year? 0 1 0 0 0  Number falls in past yr: 0 0 0 0 0  Injury with Fall? 0 0  0 0  Risk for fall due to : Medication side effect History of fall(s)  Medication side effect;Impaired vision   Follow up Falls prevention discussed;Falls evaluation completed Falls evaluation completed  Falls prevention discussed;Falls evaluation completed;Education provided     MEDICARE RISK AT HOME: Medicare Risk at Home Any stairs in or around the home?: No If so, are there any without handrails?: No Home free of loose throw rugs in walkways, pet beds, electrical cords, etc?: Yes Adequate lighting in your home to reduce risk of falls?: Yes Life alert?: No Use of a cane, walker or w/c?: Yes Grab bars in the bathroom?: No Shower chair or bench in shower?: No Elevated toilet seat or a handicapped toilet?: Yes  TIMED UP AND GO:  Was the test performed?  No    Cognitive Function:  6 CIT not administered. 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        Immunizations Immunization History  Administered Date(s) Administered   Influenza Whole 02/08/2022   Influenza,inj,Quad PF,6+ Mos 05/17/2017, 02/12/2018, 01/08/2019, 02/13/2020, 02/07/2021   PFIZER(Purple Top)SARS-COV-2 Vaccination 07/24/2019, 08/14/2019, 03/01/2020   Tdap 05/16/2019    TDAP status: Up to date  Flu Vaccine status: Due, Education has been provided regarding  the importance of this vaccine. Advised may receive this vaccine at local pharmacy or Health Dept. Aware to provide a copy of the vaccination record if obtained from local pharmacy or Health Dept. Verbalized acceptance and understanding.  Pneumococcal  vaccine status: Up to date  Covid-19 vaccine status: Information provided on how to obtain vaccines.   Qualifies for Shingles Vaccine? Yes   Zostavax completed No   Shingrix Completed?: No.    Education has been provided regarding the importance of this vaccine. Patient has been advised to call insurance company to determine out of pocket expense if they have not yet received this vaccine. Advised may also receive vaccine at local pharmacy or Health Dept. Verbalized acceptance and understanding.  Screening Tests Health Maintenance  Topic Date Due   Cervical Cancer Screening (HPV/Pap Cotest)  Never done   MAMMOGRAM  01/24/2022   INFLUENZA VACCINE  11/30/2022   Colonoscopy  05/02/2023   Medicare Annual Wellness (AWV)  02/05/2024   DTaP/Tdap/Td (2 - Td or Tdap) 05/15/2029   Hepatitis C Screening  Completed   HPV VACCINES  Aged Out   COVID-19 Vaccine  Discontinued   HIV Screening  Discontinued   Zoster Vaccines- Shingrix  Discontinued    Health Maintenance  Health Maintenance Due  Topic Date Due   Cervical Cancer Screening (HPV/Pap Cotest)  Never done   MAMMOGRAM  01/24/2022   INFLUENZA VACCINE  11/30/2022   Colonoscopy  05/02/2023    Colorectal cancer screening: Type of screening: Colonoscopy. Completed 05/01/2013. Repeat every 10 years  Mammogram status: Ordered 02/24/2022. Pt provided with contact info and advised to call to schedule appt.   Bone Density status: n/a  Lung Cancer Screening: (Low Dose CT Chest recommended if Age 45-80 years, 20 pack-year currently smoking OR have quit w/in 15years.) does not qualify.   Lung Cancer Screening Referral: no  Additional Screening:  Hepatitis C Screening: does qualify; Completed 05/16/2019  Vision Screening: Recommended annual ophthalmology exams for early detection of glaucoma and other disorders of the eye. Is the patient up to date with their annual eye exam?  Yes  Who is the provider or what is the name of the office in  which the patient attends annual eye exams? [facility] If pt is not established with a provider, would they like to be referred to a provider to establish care? No .   Dental Screening: Recommended annual dental exams for proper oral hygiene  Diabetic Foot Exam: n/a  Community Resource Referral / Chronic Care Management: CRR required this visit?  Yes   CCM required this visit?  No     Plan:     I have personally reviewed and noted the following in the patient's chart:   Medical and social history Use of alcohol, tobacco or illicit drugs  Current medications and supplements including opioid prescriptions. Patient is not currently taking opioid prescriptions. Functional ability and status Nutritional status Physical activity Advanced directives List of other physicians Hospitalizations, surgeries, and ER visits in previous 12 months Vitals Screenings to include cognitive, depression, and falls Referrals and appointments  In addition, I have reviewed and discussed with patient certain preventive protocols, quality metrics, and best practice recommendations. A written personalized care plan for preventive services as well as general preventive health recommendations were provided to patient.     Barb Merino, LPN   16/05/958   After Visit Summary: (MyChart) Due to this being a telephonic visit, the after visit summary with patients personalized plan was offered  to patient via MyChart   Nurse Notes: needs placement for nursing home or other facility

## 2023-02-05 NOTE — Patient Instructions (Signed)
Abigail Webster , Thank you for taking time to come for your Medicare Wellness Visit. I appreciate your ongoing commitment to your health goals. Please review the following plan we discussed and let me know if I can assist you in the future.   Referrals/Orders/Follow-Ups/Clinician Recommendations: referral to care guide team for placement  This is a list of the screening recommended for you and due dates:  Health Maintenance  Topic Date Due   Pap with HPV screening  Never done   Mammogram  01/24/2022   Flu Shot  11/30/2022   Colon Cancer Screening  05/02/2023   Medicare Annual Wellness Visit  02/05/2024   DTaP/Tdap/Td vaccine (2 - Td or Tdap) 05/15/2029   Hepatitis C Screening  Completed   HPV Vaccine  Aged Out   COVID-19 Vaccine  Discontinued   HIV Screening  Discontinued   Zoster (Shingles) Vaccine  Discontinued    Advanced directives: has legal guardian  Next Medicare Annual Wellness Visit scheduled for next year: No, will wait until next year since not sure if she will be placed at that time  insert Preventive Care Attachment Reference

## 2023-02-07 ENCOUNTER — Other Ambulatory Visit: Payer: 59 | Admitting: Licensed Clinical Social Worker

## 2023-02-07 NOTE — Patient Instructions (Signed)
Visit Information  Abigail Webster was given information about Medicaid Managed Care team care coordination services and verbally consented to engagement with the [facility] Managed Care team.   Following is a copy of your plan of care:  Care Plan : LCSW Plan of Care  Updates made by Gustavus Bryant, LCSW since 02/07/2023 12:00 AM     Problem: Quality of Life (General Plan of Care)      Goal: Quality of Life Maintained   Start Date: 02/07/2023  Note:   Priority: High  Timeframe:  Short Range Goal Priority:  High Start Date:  02/07/23     Expected End Date:  ongoing                     Follow Up Date--02/23/23 at 11 am  Current barriers:   Level of care concerns and need for ALF or LTC placement Physical and mental health concerns  Need for FL2 Need for connection to available community resources Need for education on ALF/LTC nursing placement process and education on facilities nearby in order to chose a facility they would like placement at. Cognitive delay/Dementia/Memory concerns  Clinical Goals: Patient's niece we work with agencies/resources/PCP/MMC LCSW to address needs related to gaining LTC placement and gaining desired appropriate and safe level of care  Patient Goals/Self-Care Activities: Over the next 30 days Complete review of emailed resources and narrow your choice down to which Facility you wish patient to go to Call Department of Social Services to follow up Medicaid transfer  Make a PCP appointment  Adult Placement [phone]  Provides case management services to community residents seeking placement into either a nursing home or adult care home when they are unable to remain in their current living situations.        24- Hour Availability:    [facility]  [address] [place], [place] [phone] Crisis [phone]   [facility] [phone]  [place] [facility]  [phone]     [facility]  [phone] (after hours)   Therapeutic Alternative/Mobile Crisis   [phone]   [place] National Suicide Hotline  519 086 3502 Len Childs) [place] [address]   Call [phone] for mental health emergencies   [facility]  [phone]);  [place] and [employer]  [phone]); [place], [place], [place], [place], [place], [place], [place]    [place] [facility] for [facility] Residents For 24/7 walk-up access to mental health services for [facility] children (4+), adolescents and adults, please visit the [facility] located at [address] in [place], [place].  *[facility] also provides comprehensive outpatient behavioral health services in a variety of locations around the [place].  Connect With [place] [address] [place], [place] [address] HelpLine: (802) 533-2932 or 1-(223) 112-2843  Get Directions  Find Help 24/7 By Phone Call our 24-hour HelpLine at 614-659-0484 or 352-098-7731 for immediate assistance for mental health and substance abuse issues.  Walk-In Help [place]: [facility] (Ages 4 and Up) [place]: Emergency Dept., [facility] Additional Resources National Hopeline Network: 1-800-SUICIDE The National Suicide Prevention Lifeline: 1-800-273-TALK     Dickie La, BSW, MSW, LCSW Managed Medicaid LCSW [facility]  [facility] [email or web address] Phone: [phone]
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
## 2023-02-08 ENCOUNTER — Ambulatory Visit (INDEPENDENT_AMBULATORY_CARE_PROVIDER_SITE_OTHER): Payer: 59

## 2023-02-08 DIAGNOSIS — Z23 Encounter for immunization: Secondary | ICD-10-CM

## 2023-02-08 NOTE — Progress Notes (Signed)
Per orders of Dr Doreene Burke, injection of Influneza Vaccine  given in Rt deltoid by Mickle Plumb, cma.  Patient tolerated injection well.

## 2023-02-23 ENCOUNTER — Other Ambulatory Visit: Payer: Self-pay | Admitting: Licensed Clinical Social Worker
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
## 2023-02-23 NOTE — Patient Instructions (Signed)
Visit Information  Abigail Webster 's legal guardian was given information about Medicaid Managed Care team care coordination services and verbally consented to engagement with the [facility] Managed Care team.    Following is a copy of your plan of care:  Care Plan : LCSW Plan of Care  Updates made by Gustavus Bryant, LCSW since 02/23/2023 12:00 AM     Problem: Quality of Life (General Plan of Care)      Goal: Quality of Life Maintained   Start Date: 02/07/2023  Note:   Priority: High  Timeframe:  Short Range Goal Priority:  High Start Date:  02/07/23     Expected End Date:  ongoing                     Follow Up Date--03/13/23 at 1 pm   Current barriers:   Level of care concerns and need for ALF or LTC placement Physical and mental health concerns  Need for FL2 Need for connection to available community resources Need for education on ALF/LTC nursing placement process and education on facilities nearby in order to chose a facility they would like placement at. Cognitive delay/Dementia/Memory concerns  Clinical Goals: Patient's niece we work with agencies/resources/PCP/MMC LCSW to address needs related to gaining LTC placement and gaining desired appropriate and safe level of care Patient Goals/Self-Care Activities: Over the next 30 days Complete review of emailed resources and narrow your choice down to which Facility you wish patient to go to Call Department of Social Services to follow up Medicaid transfer    Resource to contact-  Adult Placement [phone]  Provides case management services to community residents seeking placement into either a nursing home or adult care home when they are unable to remain in their current living situations.        24- Hour Availability:    [facility]  [address] [place], [place] [phone] Crisis [phone]   [facility] [phone]  [place] Crisis  Service  [phone]    [facility]  [phone] (after hours)   Therapeutic Alternative/Mobile Crisis   [phone]   [place] National Suicide Hotline  (667)163-9774 Len Childs) [place] 932   Call [phone] for mental health emergencies   [facility]  [phone]);  [place] and [employer]  [phone]); [place], [place], [place], [place], [place], [place], [place]    [place] [facility] Care for [facility] Residents For 24/7 walk-up access to mental health services for [facility] children (4+), adolescents and adults, please visit the [facility] located at [address] in [place], [place].  *[facility] also provides comprehensive outpatient behavioral health services in a variety of locations around the [place].  Connect With [place] [address] [place], [place] [address] HelpLine: 2043004173 or [phone]  Get Directions  Find Help 24/7 By Phone Call our 24-hour HelpLine at 9255857045 or 757-714-3228 for immediate assistance for mental health and substance abuse issues.  Walk-In Help [place]: [facility] (Ages 4 and Up) [place]: Emergency Dept., [facility] Additional Resources National Hopeline Network: 1-800-SUICIDE The National Suicide Prevention Lifeline: 1-800-273-TALK     Dickie La, BSW, MSW, LCSW Managed Medicaid LCSW [facility]  [facility] [email or web address] Phone: [phone]

## 2023-03-05 ENCOUNTER — Ambulatory Visit: Payer: 59 | Admitting: Podiatry

## 2023-03-13 ENCOUNTER — Other Ambulatory Visit: Payer: Self-pay | Admitting: Licensed Clinical Social Worker

## 2023-03-13 NOTE — Patient Instructions (Signed)
Visit Information  Abigail Webster 's legal guardian was given information about Medicaid Managed Care team care coordination services and verbally consented to engagement with the [facility] Managed Care team.   Following is a copy of your plan of care:  Care Plan : LCSW Plan of Care  Updates made by Gustavus Bryant, LCSW since 03/13/2023 12:00 AM     Problem: Quality of Life (General Plan of Care)      Goal: Quality of Life Maintained   Start Date: 02/07/2023  Note:   Priority: High  Timeframe:  Short Range Goal Priority:  High Start Date:  02/07/23     Expected End Date:  ongoing                     Follow Up Date--05/01/23 at 1 pm   Current barriers:   Level of care concerns and need for ALF or LTC placement Physical and mental health concerns  Need for FL2 Need for connection to available community resources Need for education on ALF/LTC nursing placement process and education on facilities nearby in order to chose a facility they would like placement at. Cognitive delay/Dementia/Memory concerns  Clinical Goals: Patient's niece we work with agencies/resources/PCP/[facility]MC LCSW to address needs related to gaining LTC placement and gaining desired appropriate and safe level of care  Patient Goals/Self-Care Activities: Over the next 30 days Complete review of emailed resources and narrow your choice down to which Facility you wish patient to go to Call Department of Social Services to follow up Medicaid transfer    Resource to contact-  Adult Placement [phone]  Provides case management services to community residents seeking placement into either a nursing home or adult care home when they are unable to remain in their current living situations.      24- Hour Availability:    [facility]  [address] [place], [place] [phone] Crisis [phone]   [facility] [phone]  [place] Crisis  Service  [phone]    [facility]  [phone] (after hours)   Therapeutic Alternative/Mobile Crisis   [phone]   [place] National Suicide Hotline  7344976746 Len Childs) [place] 841   Call [phone] for mental health emergencies   [facility]  [phone]);  [place] and [employer]  [phone]); [place], [place], [place], [place], [place], [employer], [place]    [facility] Residents For 24/7 walk-up access to mental health services for [facility] children (4+), adolescents and adults, please visit the [facility] located at [address] in [place], [place].  *[facility] also provides comprehensive outpatient behavioral health services in a variety of locations around the [place].  Connect With [place] [address] [place], [place] [address] HelpLine: 647-428-8894 or 1-708-235-2637  Get Directions  Find Help 24/7 By Phone Call our 24-hour HelpLine at 808-159-1817 or [phone] for immediate assistance for mental health and substance abuse issues.  Walk-In Help [place]: [facility] (Ages 4 and Up) [place]: Emergency Dept., [facility] Additional Resources National Hopeline Network: 1-800-SUICIDE The National Suicide Prevention Lifeline: 2-703-500-XFGH    Dickie La, BSW, MSW, LCSW Licensed Clinical Social Worker [employer]   [facility] [email or web address] Direct Dial: [phone]
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
## 2023-03-31 ENCOUNTER — Encounter: Payer: Self-pay | Admitting: Family Medicine

## 2023-03-31 DIAGNOSIS — R5381 Other malaise: Secondary | ICD-10-CM

## 2023-04-12 ENCOUNTER — Ambulatory Visit: Payer: 59 | Admitting: Family Medicine

## 2023-04-18 NOTE — Progress Notes (Signed)
CLINICAL USE BELOW THIS LINE (use X to signify action taken)  ___ Form received and placed in providers office for signature. __X_ Form completed and faxed to [phone] ___ Form completed & LVM to notify patient ready for pick up.  ___ Charge sheet and copy of form in front office folder for office supervisor.

## 2023-04-19 ENCOUNTER — Encounter: Payer: Self-pay | Admitting: Pulmonary Disease

## 2023-04-19 ENCOUNTER — Ambulatory Visit: Payer: 59 | Admitting: Pulmonary Disease

## 2023-04-19 VITALS — BP 138/84 | HR 64 | Ht 60.0 in | Wt 176.0 lb

## 2023-04-19 DIAGNOSIS — R0609 Other forms of dyspnea: Secondary | ICD-10-CM | POA: Diagnosis not present

## 2023-04-19 NOTE — Progress Notes (Signed)
@Patient  ID: Edgardo Roys, female    DOB: Mar 28, 1961, 62 y.o.   MRN: [id number]  Chief Complaint  Patient presents with   Consult    Having trouble breathing  when walking at short distance , she sleep sitting up     Referring provider: Mliss Sax  HPI:   62 y.o. woman whom are seen for evaluation of dyspnea on exertion.  Note from primary care provider reviewed.  History largely obtained via legal guardian, niece in the room.  Patient has cognitive deficits.  Congenital.  Unable to provide a lot of history.  Concern for shortness of breath or labored breathing with exertion over the last year or so.  Occasional wheezing.  Notes decreased activity over time.  Due to musculoskeletal issues, site issues, less active.  Becoming more stiff in the joints etc.  This seems a bit labored walking around the group home etc.  No time of day when things are better or worse.  No position to make it better or worse.  No seasonal or environmental factors she can identify.  No alleviating or exacerbating factors.  Chest x-ray 12/2022 reviewed interpreted as clear lungs bilaterally.  We discussed at length the potential etiologies.  The inability to do testing due to cognitive issues following commands and attentive etc.  Discussed therapeutic etiologies.  Largely send around underlying asthma given onset of the last year.  Out of the blue.  No inciting event.  Unable to coordinate use of inhalers.  Discussed possible nebulized therapy.  Unclear if patient would be attending enough to finish this.  But could consider in the future.    Questionaires / Pulmonary Flowsheets:   ACT:      No data to display          MMRC:     No data to display          Epworth:      No data to display          Tests:   FENO:  No results found for: "NITRICOXIDE"  PFT:     No data to display          WALK:      No data to display          Imaging: Personally reviewed and  as per EMR and discussion with note No results found.  Lab Results: Personally reviewed CBC    Component Value Date/Time   WBC 5.3 12/29/2022 1120   RBC 4.31 12/29/2022 1120   HGB 12.7 12/29/2022 1120   HCT 41.5 12/29/2022 1120   PLT 286 12/29/2022 1120   MCV 96.3 12/29/2022 1120   MCH 29.5 12/29/2022 1120   MCHC 30.6 12/29/2022 1120   RDW 14.1 12/29/2022 1120   LYMPHSABS 1.3 10/22/2022 1207   MONOABS 0.5 10/22/2022 1207   EOSABS 0.1 10/22/2022 1207   BASOSABS 0.0 10/22/2022 1207    BMET    Component Value Date/Time   NA 139 12/29/2022 1120   K 5.4 (H) 12/29/2022 1120   CL 106 12/29/2022 1120   CO2 24 12/29/2022 1120   GLUCOSE 82 12/29/2022 1120   BUN 16 12/29/2022 1120   CREATININE 0.94 12/29/2022 1120   CREATININE 1.11 (H) 05/16/2019 1628   CALCIUM 9.1 12/29/2022 1120   GFRNONAA >60 12/29/2022 1120    BNP No results found for: "BNP"  ProBNP No results found for: "PROBNP"  Specialty Problems       Pulmonary Problems  DOE (dyspnea on exertion)    No Known Allergies  Immunization History  Administered Date(s) Administered   Influenza Whole 02/08/2022   Influenza, Seasonal, Injecte, Preservative Fre 02/08/2023   Influenza,inj,Quad PF,6+ Mos 05/17/2017, 02/12/2018, 01/08/2019, 02/13/2020, 02/07/2021   PFIZER(Purple Top)SARS-COV-2 Vaccination 07/24/2019, 08/14/2019, 03/01/2020   Tdap 05/16/2019    Past Medical History:  Diagnosis Date   Arthritis    Blind right eye    Cancer (HCC)    Bladder   Chronic kidney disease    CKD 3   Developmental delay disorder    Hypothyroidism    Mentally challenged    Neuromuscular disorder (HCC)    restless leg syndrome   Thyroid disease     Tobacco History: Social History   Tobacco Use  Smoking Status Never   Passive exposure: Never  Smokeless Tobacco Never   Counseling given: Not Answered   Continue to not smoke  Outpatient Encounter Medications as of 04/19/2023  Medication Sig    Cholecalciferol (D3 SUPER STRENGTH) 50 MCG (2000 UT) CAPS Take 1 capsule (2,000 Units total) by mouth daily.   Emollient (EUCERIN EX) Apply 1 application topically daily.   EPINEPHrine 0.3 mg/0.3 mL IJ SOAJ injection Inject 0.3 mg into the muscle as needed for anaphylaxis.   gabapentin (NEURONTIN) 100 MG capsule Take 1 capsule (100 mg total) by mouth at bedtime. As needed for leg cramps   levothyroxine (SYNTHROID) 50 MCG tablet TAKE (1) TABLET BY MOUTH ONCE DAILY IN THE MORNING ON A FASTING STOMACH 1 HOUR BEFORE EATING   MYRBETRIQ 50 MG TB24 tablet Take 50 mg by mouth daily.   QUEtiapine (SEROQUEL) 100 MG tablet TAKE ONE TABLET BY MOUTH AT BEDTIME   REFRESH TEARS 0.5 % SOLN Place 1 drop into both eyes daily.   RESTASIS 0.05 % ophthalmic emulsion Place 1 drop into both eyes 2 (two) times daily.   Scar Treatment Products [facility] ADVANCED SCAR GEL) GEL Cover healed wounds daily with a thin coat.   No facility-administered encounter medications on file as of 04/19/2023.     Review of Systems  Review of Systems  No chest pain with exertion.  No orthopnea or PND.  Comprehensive review of systems otherwise negative. Physical Exam  BP 138/84   Pulse 64   Ht 5' (1.524 m)   Wt 176 lb (79.8 kg)   BMI 34.37 kg/m   Wt Readings from Last 5 Encounters:  04/19/23 176 lb (79.8 kg)  01/10/23 178 lb 12.8 oz (81.1 kg)  12/29/22 173 lb (78.5 kg)  11/28/22 175 lb (79.4 kg)  10/22/22 171 lb 15.3 oz (78 kg)    BMI Readings from Last 5 Encounters:  04/19/23 34.37 kg/m  01/10/23 34.92 kg/m  12/29/22 33.79 kg/m  11/28/22 32.01 kg/m  10/22/22 31.45 kg/m     Physical Exam General: Fatigued, slumped over in chair Eyes: EOMI, no icterus Neck: Unable to assess JVP, range of motion intact Pulmonary: Clear bilaterally, normal work of breathing on room air Cardiovascular: Regular rate and rhythm, no murmur Abdomen: Nondistended, bowel sounds present MSK: No synovitis, no joint effusion Neuro:  Ambulates with assistance of walker, gait is slowed, no focal deficits Psych: Inattentive at times, alert   Assessment & Plan:   Dyspnea on exertion: Gradually developed over the last year or so.  Chest imaging is clear which is reassuring.  High suspicion for deconditioning, extrathoracic restriction due to habitus, kyphosis.  Given cognitive issues, ability to test with PFTs and additional imaging is limited.  Treatment also very limited.  Discussed possibility of asthma.  Offered nebulized therapies, patient unable to coordinate inhaler therapies.  Right now given relative mild nature of symptoms, caregiver and I after shared decision making did elect to hold off on additional medicines at this time.  Consider in the future if worsening or cough etc. Develops.   Return in about 1 year (around 04/18/2024).   Karren Burly, MD 04/19/2023   This appointment required 60 minutes of patient care (this includes precharting, chart review, review of results, face-to-face care, etc.).

## 2023-04-23 ENCOUNTER — Ambulatory Visit: Payer: 59 | Admitting: Family Medicine

## 2023-04-23 ENCOUNTER — Encounter: Payer: Self-pay | Admitting: Family Medicine

## 2023-04-23 VITALS — BP 144/88 | HR 61 | Temp 98.2°F | Ht 60.0 in | Wt 174.6 lb

## 2023-04-23 DIAGNOSIS — E559 Vitamin D deficiency, unspecified: Secondary | ICD-10-CM

## 2023-04-23 DIAGNOSIS — R030 Elevated blood-pressure reading, without diagnosis of hypertension: Secondary | ICD-10-CM

## 2023-04-23 DIAGNOSIS — Z13220 Encounter for screening for lipoid disorders: Secondary | ICD-10-CM

## 2023-04-23 DIAGNOSIS — Z131 Encounter for screening for diabetes mellitus: Secondary | ICD-10-CM | POA: Diagnosis not present

## 2023-04-23 DIAGNOSIS — E039 Hypothyroidism, unspecified: Secondary | ICD-10-CM | POA: Diagnosis not present

## 2023-04-23 NOTE — Progress Notes (Signed)
Established Patient Office Visit   Subjective:  Patient ID: Abigail Webster, female    DOB: [birth year]  Age: 62 y.o. MRN: [id number]  Chief Complaint  Patient presents with   Medical Management of Chronic Issues    3 month follow up. Pt is not fasting.     HPI Encounter Diagnoses  Name Primary?   Acquired hypothyroidism Yes   Vitamin D deficiency    Elevated BP without diagnosis of hypertension    Screening for cholesterol level    Screening for diabetes mellitus    For follow-up of above.  Accompanied by her niece and guardian, Karel Jarvis.  Patient is moving to a facility in [place].  May still come here for medical care.  Status post follow-up with pulmonology and neurology for DOE and bladder cancer.  She is in an intermediate facility and has not been sleeping well at night.  She has been napping.   Review of Systems  Constitutional: Negative.   HENT: Negative.    Eyes:  Negative for blurred vision, discharge and redness.  Respiratory: Negative.    Cardiovascular: Negative.   Gastrointestinal:  Negative for abdominal pain.  Genitourinary: Negative.   Musculoskeletal: Negative.  Negative for myalgias.  Skin:  Negative for rash.  Neurological:  Negative for tingling, loss of consciousness and weakness.  Endo/Heme/Allergies:  Negative for polydipsia.  Psychiatric/Behavioral:  The patient has insomnia.      Current Outpatient Medications:    Cholecalciferol (D3 SUPER STRENGTH) 50 MCG (2000 UT) CAPS, Take 1 capsule (2,000 Units total) by mouth daily., Disp: 30 capsule, Rfl: 3   Emollient (EUCERIN EX), Apply 1 application topically daily., Disp: , Rfl:    EPINEPHrine 0.3 mg/0.3 mL IJ SOAJ injection, Inject 0.3 mg into the muscle as needed for anaphylaxis., Disp: 2 each, Rfl: 1   gabapentin (NEURONTIN) 100 MG capsule, Take 1 capsule (100 mg total) by mouth at bedtime. As needed for leg cramps, Disp: 90 capsule, Rfl: 3   levothyroxine (SYNTHROID) 50 MCG tablet, TAKE (1) TABLET BY  MOUTH ONCE DAILY IN THE MORNING ON A FASTING STOMACH 1 HOUR BEFORE EATING, Disp: 90 tablet, Rfl: 3   REFRESH TEARS 0.5 % SOLN, Place 1 drop into both eyes daily., Disp: , Rfl:    RESTASIS 0.05 % ophthalmic emulsion, Place 1 drop into both eyes 2 (two) times daily., Disp: , Rfl:    Scar Treatment Products [facility] ADVANCED SCAR GEL) GEL, Cover healed wounds daily with a thin coat., Disp: 50 g, Rfl: 1   MYRBETRIQ 50 MG TB24 tablet, Take 50 mg by mouth daily. (Patient not taking: Reported on 04/23/2023), Disp: , Rfl:    QUEtiapine (SEROQUEL) 100 MG tablet, TAKE ONE TABLET BY MOUTH AT BEDTIME (Patient not taking: Reported on 04/23/2023), Disp: 30 tablet, Rfl: 5   Objective:     BP (!) 144/88 (Cuff Size: Large)   Pulse 61   Temp 98.2 F (36.8 C)   Ht 5' (1.524 m)   Wt 174 lb 9.6 oz (79.2 kg)   SpO2 97%   BMI 34.10 kg/m  BP Readings from Last 3 Encounters:  04/23/23 (!) 144/88  04/19/23 138/84  01/10/23 122/84   Wt Readings from Last 3 Encounters:  04/23/23 174 lb 9.6 oz (79.2 kg)  04/19/23 176 lb (79.8 kg)  01/10/23 178 lb 12.8 oz (81.1 kg)      Physical Exam Constitutional:      General: She is not in acute distress.    Appearance: Normal  appearance. She is not ill-appearing, toxic-appearing or diaphoretic.  HENT:     Head: Normocephalic and atraumatic.     Right Ear: External ear normal.     Left Ear: External ear normal.  Cardiovascular:     Rate and Rhythm: Normal rate and regular rhythm.     Heart sounds: Heart sounds are distant.  Pulmonary:     Effort: Pulmonary effort is normal. No respiratory distress.     Breath sounds: No wheezing, rhonchi or rales.  Skin:    General: Skin is warm and dry.  Neurological:     Mental Status: She is alert and oriented to person, place, and time.  Psychiatric:        Mood and Affect: Mood normal.        Behavior: Behavior normal.      No results found for any visits on 04/23/23.    The ASCVD Risk score (Arnett DK, et al.,  2019) failed to calculate for the following reasons:   Cannot find a previous HDL lab   Cannot find a previous total cholesterol lab    Assessment & Plan:   Acquired hypothyroidism -     TSH  Vitamin D deficiency -     VITAMIN D 25 Hydroxy (Vit-D Deficiency, Fractures)  Elevated BP without diagnosis of hypertension -     CBC -     Comprehensive metabolic panel  Screening for cholesterol level -     Lipid panel  Screening for diabetes mellitus -     Hemoglobin A1c    Return in about 3 months (around 07/22/2023).  Pulmonary consultation was inconclusive because of patient's inability to participate with testing.  DOE continues.  Borderline cardiomegaly seen on prior chest x-ray.  Could consider cardiology referral.  Patient's niece and guardian have been able to let me know about a possible cardiology consultation in the [place] area.  Believe that her sleeping difficulty could be secondary to her temporary living situation.  Encouraged daytime stimulation to avoid napping.  Can readdress next visit.  Mliss Sax, MD

## 2023-04-24 LAB — CBC
HCT: 40.3 % (ref 36.0–46.0)
Hemoglobin: 13.2 g/dL (ref 12.0–15.0)
MCHC: 32.8 g/dL (ref 30.0–36.0)
MCV: 92 fL (ref 78.0–100.0)
Platelets: 258 10*3/uL (ref 150.0–400.0)
RBC: 4.38 Mil/uL (ref 3.87–5.11)
RDW: 14.9 % (ref 11.5–15.5)
WBC: 6.6 10*3/uL (ref 4.0–10.5)

## 2023-04-24 LAB — COMPREHENSIVE METABOLIC PANEL
ALT: 11 U/L (ref 0–35)
AST: 14 U/L (ref 0–37)
Albumin: 4.3 g/dL (ref 3.5–5.2)
Alkaline Phosphatase: 106 U/L (ref 39–117)
BUN: 22 mg/dL (ref 6–23)
CO2: 29 meq/L (ref 19–32)
Calcium: 9 mg/dL (ref 8.4–10.5)
Chloride: 104 meq/L (ref 96–112)
Creatinine, Ser: 1.11 mg/dL (ref 0.40–1.20)
GFR: 53.16 mL/min — ABNORMAL LOW (ref >60.00)
Glucose, Bld: 112 mg/dL — ABNORMAL HIGH (ref 70–99)
Potassium: 4.3 meq/L (ref 3.5–5.1)
Sodium: 142 meq/L (ref 135–145)
Total Bilirubin: 0.6 mg/dL (ref 0.2–1.2)
Total Protein: 6.8 g/dL (ref 6.0–8.3)

## 2023-04-24 LAB — TSH: TSH: 2.26 u[IU]/mL (ref 0.35–5.50)

## 2023-04-24 LAB — LIPID PANEL
Cholesterol: 209 mg/dL — ABNORMAL HIGH (ref 0–200)
HDL: 65.1 mg/dL (ref >39.00)
LDL Cholesterol: 127 mg/dL — ABNORMAL HIGH (ref 0–99)
NonHDL: 144.07
Total CHOL/HDL Ratio: 3
Triglycerides: 87 mg/dL (ref 0.0–149.0)
VLDL: 17.4 mg/dL (ref 0.0–40.0)

## 2023-04-24 LAB — HEMOGLOBIN A1C: Hgb A1c MFr Bld: 5.6 % (ref 4.6–6.5)

## 2023-04-24 LAB — VITAMIN D 25 HYDROXY (VIT D DEFICIENCY, FRACTURES): VITD: 48.4 ng/mL (ref 30.00–100.00)

## 2023-04-26 ENCOUNTER — Ambulatory Visit
Admission: EM | Admit: 2023-04-26 | Discharge: 2023-04-26 | Disposition: A | Discharge status: Home / Self Care | Payer: 59 | Attending: Family Medicine | Admitting: Family Medicine

## 2023-04-26 DIAGNOSIS — R112 Nausea with vomiting, unspecified: Secondary | ICD-10-CM

## 2023-04-26 DIAGNOSIS — A084 Viral intestinal infection, unspecified: Secondary | ICD-10-CM

## 2023-04-26 MED ORDER — ONDANSETRON 8 MG PO TBDP: 8.0000 mg | ORAL_TABLET | Freq: Three times a day (TID) | ORAL | 0 refills | Status: DC | PRN

## 2023-04-26 MED ORDER — LOPERAMIDE HCL 2 MG PO CAPS: 2.0000 mg | ORAL_CAPSULE | Freq: Two times a day (BID) | ORAL | 0 refills | Status: DC | PRN

## 2023-04-26 NOTE — ED Provider Notes (Signed)
[facility] - URGENT CARE CENTER  Note:  This document was prepared using Conservation officer, historic buildings and may include unintentional dictation errors.  MRN: [id number] DOB: [birth year]  Subjective:   Abigail Webster is a 62 y.o. female presenting for acute onset today of persistent vomiting, nausea, decreased appetite.  Has also had decreased energy.  No diarrhea.  No cough, chest pain, shortness of breath or wheezing.  Lives in a community home.  No bloody stools.  No recent antibiotic use.  No current facility-administered medications for this encounter.  Current Outpatient Medications:    Cholecalciferol (D3 SUPER STRENGTH) 50 MCG (2000 UT) CAPS, Take 1 capsule (2,000 Units total) by mouth daily., Disp: 30 capsule, Rfl: 3   Emollient (EUCERIN EX), Apply 1 application topically daily., Disp: , Rfl:    EPINEPHrine 0.3 mg/0.3 mL IJ SOAJ injection, Inject 0.3 mg into the muscle as needed for anaphylaxis., Disp: 2 each, Rfl: 1   gabapentin (NEURONTIN) 100 MG capsule, Take 1 capsule (100 mg total) by mouth at bedtime. As needed for leg cramps, Disp: 90 capsule, Rfl: 3   levothyroxine (SYNTHROID) 50 MCG tablet, TAKE (1) TABLET BY MOUTH ONCE DAILY IN THE MORNING ON A FASTING STOMACH 1 HOUR BEFORE EATING, Disp: 90 tablet, Rfl: 3   MYRBETRIQ 50 MG TB24 tablet, Take 50 mg by mouth daily. (Patient not taking: Reported on 04/23/2023), Disp: , Rfl:    QUEtiapine (SEROQUEL) 100 MG tablet, TAKE ONE TABLET BY MOUTH AT BEDTIME, Disp: 30 tablet, Rfl: 5   REFRESH TEARS 0.5 % SOLN, Place 1 drop into both eyes daily., Disp: , Rfl:    RESTASIS 0.05 % ophthalmic emulsion, Place 1 drop into both eyes 2 (two) times daily., Disp: , Rfl:    Scar Treatment Products [facility] ADVANCED SCAR GEL) GEL, Cover healed wounds daily with a thin coat., Disp: 50 g, Rfl: 1   No Known Allergies  Past Medical History:  Diagnosis Date   Arthritis    Blind right eye    Cancer (HCC)    Bladder   Chronic kidney disease     CKD 3   Developmental delay disorder    Hypothyroidism    Mentally challenged    Neuromuscular disorder (HCC)    restless leg syndrome   Thyroid disease      Past Surgical History:  Procedure Laterality Date   ABDOMINAL HYSTERECTOMY     BLADDER TUMOR EXCISION  12/29/2022   Bilteral Cystectomy - [facility]   CYSTOSCOPY W/ RETROGRADES Bilateral 12/29/2022   Procedure: CYSTOSCOPY WITH BILATERAL RETROGRADE PYELOGRAM;  Surgeon: Crista Elliot, MD;  Location: [facility];  Service: Urology;  Laterality: Bilateral;  45 MINS FRO CASE   FOOT SURGERY     TRANSURETHRAL RESECTION OF BLADDER TUMOR WITH MITOMYCIN-C N/A 10/18/2020   Procedure: TRANSURETHRAL RESECTION OF BLADDER TUMOR WITH GEMCITABINE;  Surgeon: Crista Elliot, MD;  Location: [facility];  Service: Urology;  Laterality: N/A;    Family History  Problem Relation Age of Onset   Pancreatic cancer Mother    Hypertension Mother    Hypothyroidism Mother    Colon cancer Father    Hypertension Father     Social History   Tobacco Use   Smoking status: Never    Passive exposure: Never   Smokeless tobacco: Never  Vaping Use   Vaping status: Never Used  Substance Use Topics   Alcohol use: Never   Drug use: Never    ROS   Objective:  Vitals: BP (!) 145/82 (BP Location: Left Arm)   Pulse 81   Temp 97.8 F (36.6 C) (Oral)   Resp 18   SpO2 96%   Physical Exam Constitutional:      General: She is not in acute distress.    Appearance: Normal appearance. She is well-developed. She is not ill-appearing, toxic-appearing or diaphoretic.  HENT:     Head: Normocephalic and atraumatic.     Nose: Nose normal.     Mouth/Throat:     Mouth: Mucous membranes are moist.  Eyes:     General: No scleral icterus.       Right eye: No discharge.        Left eye: No discharge.     Extraocular Movements: Extraocular movements intact.     Conjunctiva/sclera: Conjunctivae normal.  Cardiovascular:     Rate and Rhythm: Normal rate  and regular rhythm.     Heart sounds: Normal heart sounds. No murmur heard.    No friction rub. No gallop.  Pulmonary:     Effort: Pulmonary effort is normal. No respiratory distress.     Breath sounds: No stridor. No wheezing, rhonchi or rales.  Chest:     Chest wall: No tenderness.  Abdominal:     General: Bowel sounds are increased. There is no distension.     Palpations: Abdomen is soft. There is no mass.     Tenderness: There is no abdominal tenderness. There is no right CVA tenderness, left CVA tenderness, guarding or rebound.  Skin:    General: Skin is warm and dry.  Neurological:     General: No focal deficit present.     Mental Status: She is alert and oriented to person, place, and time.  Psychiatric:        Mood and Affect: Mood normal.        Behavior: Behavior normal.        Thought Content: Thought content normal.        Judgment: Judgment normal.     Assessment and Plan :   PDMP not reviewed this encounter.  1. Viral gastroenteritis   2. Nausea and vomiting, unspecified vomiting type    Will manage for suspected viral gastroenteritis with supportive care.  Recommended patient hydrate well, eat light meals and maintain electrolytes.  Will use Zofran and Imodium for nausea, vomiting and diarrhea. Counseled patient on potential for adverse effects with medications prescribed/recommended today, ER and return-to-clinic precautions discussed, patient verbalized understanding.    Wallis Bamberg, [place] 04/26/23 [id number]

## 2023-04-26 NOTE — Discharge Instructions (Signed)
Make sure you push fluids drinking mostly water but mix it with Gatorade Zero (50/50).  Try to eat light meals including soups, broths and soft foods, fruits.  You may use Zofran for your nausea and vomiting once every 8 hours.  Imodium can help with diarrhea but use this carefully limiting it to 1-2 times per day only if you are having a lot of diarrhea.  Please return to the clinic if symptoms worsen or you start having severe abdominal pain not helped by taking Tylenol or start having bloody stools or blood in the vomit.

## 2023-04-26 NOTE — ED Triage Notes (Signed)
Per legal guardian/niece, pt has fatigue, emesis and is "not herself" as she is having low energy, which is not normal, started today. Per legal guardian/niecept was at the PCP on 04/23/23 and everything was fine. Pt denies abdominal pain, chest pain, headache.

## 2023-05-01 ENCOUNTER — Other Ambulatory Visit: Payer: Self-pay | Admitting: Licensed Clinical Social Worker

## 2023-05-01 NOTE — Patient Instructions (Signed)
 Visit Information  Ms. Jolin legal guardian was given information about Medicaid Managed Care team care coordination services and verbally consented to engagement with the [facility] Managed Care team.   Following is a copy of your plan of care:  Care Plan : LCSW Plan of Care  Updates made by Merlynn Lyle CROME, LCSW since 05/01/2023 12:00 AM     Problem: Quality of Life (General Plan of Care)      Goal: Quality of Life Maintained   Start Date: 02/07/2023  Note:   Priority: High  Timeframe:  Short Range Goal Priority:  High Start Date:  02/07/23     Expected End Date:  ongoing                     Follow Up Date--Goal closed as of 05/01/23, resources for LTC placement have been successfully provided.  Current barriers:   Level of care concerns and need for ALF or LTC placement Physical and mental health concerns  Need for FL2 Need for connection to available community resources Need for education on ALF/LTC nursing placement process and education on facilities nearby in order to chose a facility they would like placement at. Cognitive delay/Dementia/Memory concerns  Clinical Goals: Patient's niece we work with agencies/resources/PCP/[facility] LCSW to address needs related to gaining LTC placement and gaining desired appropriate and safe level of care  Patient Goals/Self-Care Activities: Over the next 30 days Complete review of emailed resources and narrow your choice down to which Facility you wish patient to go to Call Department of Social Services to follow up Medicaid transfer    Resource to contact-  Adult Placement [phone]  Provides case management services to community residents seeking placement into either a nursing home or adult care home when they are unable to remain in their current living situations.  Discharge from [facility] program on 05/01/23       24- Hour Availability:    [facility]  [address] [place], [place]  [phone] Crisis [phone]   [facility] [phone]  [facility] Crisis Service  [phone]    [facility]  [phone] (after hours)   Therapeutic Alternative/Mobile Crisis   [phone]   [place]  National Suicide Hotline  564-357-1820 [place]) OR 988   Call 988 for mental health emergencies   [facility]  [phone]);  [place] and [employer]  [phone]); [place], [place], [place], [place], [place], [place], [place]    [facility] for [facility] Residents For 24/7 walk-up access to mental health services for [facility] children (4+), adolescents and adults, please visit the [facility] located at [address] in [place], [place].  *[facility] also provides comprehensive outpatient behavioral health services in a variety of locations around the [place].  Connect With Us  [address] [place], [place] [address] HelpLine: (585) 139-8987 or 1-3378805060  Get Directions  Find Help 24/7 By Phone Call our 24-hour HelpLine at (216) 358-2177 or 610-802-9484 for immediate assistance for mental health and substance abuse issues.  Walk-In Help [place]: [facility] (Ages 4 and Up) [place]: Emergency Dept., [facility] Additional Resources National Hopeline Network: 1-800-SUICIDE The National Suicide Prevention Lifeline: 8-199-726-UJOX     Lyle Merlynn, BSW, MSW, LCSW Licensed Clinical Social Worker [employer]   [email or web address] Direct Dial: [phone]
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
## 2023-05-14 ENCOUNTER — Ambulatory Visit (INDEPENDENT_AMBULATORY_CARE_PROVIDER_SITE_OTHER): Payer: 59 | Admitting: Podiatry

## 2023-05-14 ENCOUNTER — Encounter: Payer: Self-pay | Admitting: Podiatry

## 2023-05-14 DIAGNOSIS — L84 Corns and callosities: Secondary | ICD-10-CM | POA: Diagnosis not present

## 2023-05-14 DIAGNOSIS — B351 Tinea unguium: Secondary | ICD-10-CM | POA: Diagnosis not present

## 2023-05-14 DIAGNOSIS — M79675 Pain in left toe(s): Secondary | ICD-10-CM

## 2023-05-14 DIAGNOSIS — M79674 Pain in right toe(s): Secondary | ICD-10-CM

## 2023-05-14 NOTE — Progress Notes (Signed)
Complaint:  Visit Type: Patient returns to my office for continued preventative foot care services.  Patient states" my nails have grown long and thick and become painful to walk and wear shoes".    The patient presents for preventative foot care services. She presents to the office with her caregiver.    Podiatric Exam: Vascular: dorsalis pedis and posterior tibial pulses are palpable bilateral. Capillary return is immediate. Temperature gradient is WNL. Skin turgor WNL  Sensorium: Normal Semmes Weinstein monofilament test. Normal tactile sensation bilaterally. Nail Exam: Pt has thick disfigured discolored nails with subungual debris noted bilateral entire nail hallux through fifth toenails Ulcer Exam: There is no evidence of ulcer or pre-ulcerative changes or infection. Orthopedic Exam: Muscle tone and strength are WNL. No limitations in general ROM. No crepitus or effusions noted. Foot type and digits show no abnormalities. Bony prominences are unremarkable. Pes planus. Skin: No Porokeratosis. No infection or ulcers.  Pinch callus symptomatic. B/L.  Diagnosis:  Onychomycosis, , Pain in right toe, pain in left toes,  Pinch callus hallux B/L.  Treatment & Plan Procedures and Treatment: Consent by patient was obtained for treatment procedures.   Debridement of mycotic and hypertrophic toenails, 1 through 5 bilateral and clearing of subungual debris using nail nipper followed by dremel..Debride callus with # 15 blade and dremel tool. No ulceration, no infection noted.  Return Visit-Office Procedure: Patient instructed to return to the office for a follow up visit  3 months  for continued evaluation and treatment.    Helane Gunther DPM

## 2023-05-21 ENCOUNTER — Ambulatory Visit (INDEPENDENT_AMBULATORY_CARE_PROVIDER_SITE_OTHER): Payer: 59 | Admitting: Internal Medicine

## 2023-05-21 ENCOUNTER — Encounter: Payer: Self-pay | Admitting: Internal Medicine

## 2023-05-21 VITALS — BP 130/84 | HR 60 | Temp 98.7°F | Ht 60.0 in | Wt 178.2 lb

## 2023-05-21 DIAGNOSIS — R3989 Other symptoms and signs involving the genitourinary system: Secondary | ICD-10-CM | POA: Diagnosis not present

## 2023-05-21 DIAGNOSIS — R8290 Unspecified abnormal findings in urine: Secondary | ICD-10-CM

## 2023-05-21 MED ORDER — SULFAMETHOXAZOLE-TRIMETHOPRIM 800-160 MG PO TABS: 1.0000 | ORAL_TABLET | Freq: Two times a day (BID) | ORAL | 0 refills | Status: AC
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
## 2023-05-22 ENCOUNTER — Other Ambulatory Visit: Payer: Self-pay | Admitting: Family Medicine

## 2023-05-22 ENCOUNTER — Telehealth: Payer: Self-pay

## 2023-05-22 DIAGNOSIS — Z7289 Other problems related to lifestyle: Secondary | ICD-10-CM

## 2023-05-22 DIAGNOSIS — F5105 Insomnia due to other mental disorder: Secondary | ICD-10-CM

## 2023-05-22 LAB — URINALYSIS W MICROSCOPIC + REFLEX CULTURE
Bilirubin Urine: NEGATIVE
Glucose, UA: NEGATIVE
Hgb urine dipstick: NEGATIVE
Hyaline Cast: NONE SEEN /[LPF]
Ketones, ur: NEGATIVE
Leukocyte Esterase: NEGATIVE
Nitrites, Initial: NEGATIVE
Protein, ur: NEGATIVE
RBC / HPF: NONE SEEN /[HPF] (ref 0–2)
Specific Gravity, Urine: 1.02 (ref 1.001–1.035)
pH: 6.5 (ref 5.0–8.0)

## 2023-05-22 LAB — NO CULTURE INDICATED

## 2023-05-22 NOTE — Telephone Encounter (Signed)
Spoke with Abigail Webster to let her know that [facility] medical Consultation for has been complete and will be placed up front at check in

## 2023-05-23 ENCOUNTER — Encounter: Payer: Self-pay | Admitting: Family Medicine

## 2023-08-10 ENCOUNTER — Other Ambulatory Visit: Payer: Self-pay | Admitting: Family Medicine

## 2023-08-10 DIAGNOSIS — G4762 Sleep related leg cramps: Secondary | ICD-10-CM

## 2023-08-16 ENCOUNTER — Other Ambulatory Visit: Payer: Self-pay | Admitting: Family Medicine

## 2023-08-16 DIAGNOSIS — E039 Hypothyroidism, unspecified: Secondary | ICD-10-CM

## 2023-08-23 ENCOUNTER — Encounter: Payer: Self-pay | Admitting: Family Medicine

## 2023-08-24 NOTE — Telephone Encounter (Signed)
 Copied from CRM [id number]. Topic: Clinical - Medical Advice >> Aug 24, 2023  1:20 PM Jethro Morrison wrote: Reason for CRM: RODNEY BOVAIN OR EBONY [id number] STATED SHE NEEDS A NEW SHOWER CHAIR

## 2023-09-12 ENCOUNTER — Encounter: Payer: Self-pay | Admitting: Podiatry

## 2023-09-12 ENCOUNTER — Ambulatory Visit (INDEPENDENT_AMBULATORY_CARE_PROVIDER_SITE_OTHER): Payer: 59 | Admitting: Podiatry

## 2023-09-12 DIAGNOSIS — M79674 Pain in right toe(s): Secondary | ICD-10-CM | POA: Diagnosis not present

## 2023-09-12 DIAGNOSIS — B351 Tinea unguium: Secondary | ICD-10-CM | POA: Diagnosis not present

## 2023-09-12 DIAGNOSIS — M79675 Pain in left toe(s): Secondary | ICD-10-CM | POA: Diagnosis not present

## 2023-09-12 NOTE — Progress Notes (Signed)
 Complaint:  Visit Type: Patient returns to my office for continued preventative foot care services.  Patient states" my nails have grown long and thick and become painful to walk and wear shoes".    The patient presents for preventative foot care services. She presents to the office with her caregiver.    Podiatric Exam: Vascular: dorsalis pedis and posterior tibial pulses are palpable bilateral. Capillary return is immediate. Temperature gradient is WNL. Skin turgor WNL  Sensorium: Normal Semmes Weinstein monofilament test. Normal tactile sensation bilaterally. Nail Exam: Pt has thick disfigured discolored nails with subungual debris noted bilateral entire nail hallux through fifth toenails Ulcer Exam: There is no evidence of ulcer or pre-ulcerative changes or infection. Orthopedic Exam: Muscle tone and strength are WNL. No limitations in general ROM. No crepitus or effusions noted. Foot type and digits show no abnormalities. Bony prominences are unremarkable. Pes planus. Skin: No Porokeratosis. No infection or ulcers.  Pinch callus symptomatic. B/L.  Diagnosis:  Onychomycosis, , Pain in right toe, pain in left toes,  Pinch callus hallux B/L.  Treatment & Plan Procedures and Treatment: Consent by patient was obtained for treatment procedures.   Debridement of mycotic and hypertrophic toenails, 1 through 5 bilateral and clearing of subungual debris using nail nipper followed by dremel   No ulceration, no infection noted.  Return Visit-Office Procedure: Patient instructed to return to the office for a follow up visit  3 months  for continued evaluation and treatment.    Ruffin Cotton DPM

## 2023-09-19 ENCOUNTER — Other Ambulatory Visit: Payer: Self-pay | Admitting: Family Medicine

## 2023-09-19 DIAGNOSIS — E039 Hypothyroidism, unspecified: Secondary | ICD-10-CM

## 2023-09-20 ENCOUNTER — Encounter: Payer: Self-pay | Admitting: Family Medicine

## 2023-09-20 ENCOUNTER — Ambulatory Visit (INDEPENDENT_AMBULATORY_CARE_PROVIDER_SITE_OTHER): Admitting: Family Medicine

## 2023-09-20 VITALS — BP 130/80 | HR 71 | Temp 97.6°F | Ht 60.0 in

## 2023-09-20 DIAGNOSIS — E039 Hypothyroidism, unspecified: Secondary | ICD-10-CM

## 2023-09-20 DIAGNOSIS — R5381 Other malaise: Secondary | ICD-10-CM

## 2023-09-20 DIAGNOSIS — R2681 Unsteadiness on feet: Secondary | ICD-10-CM | POA: Diagnosis not present

## 2023-09-20 DIAGNOSIS — R4689 Other symptoms and signs involving appearance and behavior: Secondary | ICD-10-CM

## 2023-09-20 LAB — TSH: TSH: 3.5 u[IU]/mL (ref 0.35–5.50)

## 2023-09-20 MED ORDER — LEVOTHYROXINE SODIUM 50 MCG PO TABS: ORAL_TABLET | ORAL | 3 refills | Status: DC

## 2023-09-20 NOTE — Addendum Note (Signed)
 Addended by: Delene Feinstein on: 09/20/2023 04:57 PM   Modules accepted: Orders

## 2023-09-20 NOTE — Progress Notes (Addendum)
 Established Patient Office Visit   Subjective:  Patient ID: Abigail Webster, female    DOB: Jul 26, 1960  Age: 63 y.o. MRN: [id number]  Chief Complaint  Patient presents with   Nasal Congestion    Started yesterday; yellow mucus; no other symptoms    Insect Bite    Got bit by spider yesterday on lower back on left side while at the park; there was bleeding and just want it checked out    HPI Encounter Diagnoses  Name Primary?   Acquired hypothyroidism Yes   Debilitated    Gait instability    Behavioral change    For follow-up of above.  She is accompanied by her caregiver.  Her niece, Marysue Sola pends on the phone.  She is concerned about the Seroquel  use at at bedtime.  She wonders if we could taper her off of it.  She is now in a home with a higher level of care.  She is living in the [place] area.  She will be vacationing to [place]  to visit family soon.   Review of Systems  Constitutional: Negative.   HENT: Negative.    Eyes:  Negative for blurred vision, discharge and redness.  Respiratory: Negative.    Cardiovascular: Negative.   Gastrointestinal:  Negative for abdominal pain.  Genitourinary: Negative.   Musculoskeletal: Negative.  Negative for myalgias.  Skin:  Negative for rash.  Neurological:  Negative for tingling, loss of consciousness and weakness.  Endo/Heme/Allergies:  Negative for polydipsia.     Current Outpatient Medications:    Cholecalciferol (D3 SUPER STRENGTH) 50 MCG (2000 UT) CAPS, Take 1 capsule (2,000 Units total) by mouth daily., Disp: 30 capsule, Rfl: 3   Emollient (EUCERIN EX), Apply 1 application topically daily., Disp: , Rfl:    EPINEPHrine  0.3 mg/0.3 mL IJ SOAJ injection, Inject 0.3 mg into the muscle as needed for anaphylaxis., Disp: 2 each, Rfl: 1   gabapentin  (NEURONTIN ) 100 MG capsule, TAKE ONE CAPSULE BY MOUTH AT BEDTIME AS NEEDED FOR LEG CRAMPS, Disp: 30 capsule, Rfl: 0   MYRBETRIQ 50 MG TB24 tablet, Take 50 mg by mouth daily.,  Disp: , Rfl:    QUEtiapine  (SEROQUEL ) 100 MG tablet, TAKE ONE TABLET BY MOUTH AT BEDTIME, Disp: 30 tablet, Rfl: 5   REFRESH TEARS 0.5 % SOLN, Place 1 drop into both eyes daily., Disp: , Rfl:    RESTASIS 0.05 % ophthalmic emulsion, Place 1 drop into both eyes 2 (two) times daily., Disp: , Rfl:    Scar Treatment Products [facility] ADVANCED SCAR GEL) GEL, Cover healed wounds daily with a thin coat., Disp: 50 g, Rfl: 1   levothyroxine  (SYNTHROID ) 50 MCG tablet, TAKE 1 TABLET BY MOUTH EVERY MORNING ON A FASTING STOMACH 1 HOUR BEFORE EATING, Disp: 90 tablet, Rfl: 3   VITAMIN D , ERGOCALCIFEROL , PO, Vitamin D  (Ergocalciferol ), Disp: , Rfl:    Objective:     BP 130/80   Pulse 71   Temp 97.6 F (36.4 C) (Temporal)   Ht 5' (1.524 m)   SpO2 99%   BMI 34.80 kg/m    Physical Exam Constitutional:      General: She is not in acute distress.    Appearance: Normal appearance. She is not ill-appearing, toxic-appearing or diaphoretic.  HENT:     Head: Normocephalic and atraumatic.     Right Ear: External ear normal.     Left Ear: External ear normal.  Eyes:     General: No scleral icterus.  Right eye: No discharge.        Left eye: No discharge.     Extraocular Movements: Extraocular movements intact.     Conjunctiva/sclera: Conjunctivae normal.  Cardiovascular:     Rate and Rhythm: Normal rate and regular rhythm.     Heart sounds: Heart sounds are distant.  Pulmonary:     Effort: Pulmonary effort is normal. No respiratory distress.     Breath sounds: No wheezing or rhonchi.  Skin:    General: Skin is warm and dry.  Neurological:     Mental Status: She is alert and oriented to person, place, and time.  Psychiatric:        Mood and Affect: Mood normal.        Behavior: Behavior normal.      Results for orders placed or performed in visit on 09/20/23  TSH  Result Value Ref Range   TSH 3.50 0.35 - 5.50 uIU/mL      The 10-year ASCVD risk score (Arnett DK, et al., 2019) is:  6.8%    Assessment & Plan:   Acquired hypothyroidism -     TSH -     Levothyroxine  Sodium; TAKE 1 TABLET BY MOUTH EVERY MORNING ON A FASTING STOMACH 1 HOUR BEFORE EATING  Dispense: 90 tablet; Refill: 3  Debilitated -     Clinical biochemist change    Return in about 6 months (around 03/22/2024).  Discussed the need for Seroquel  use with Ebony.  It is helping Reagen sleep better at night.  Terrry's behavior and congeniality  is improved while taking it.  She is less likely to engage in self-mutilation behaviors with the Seroquel .   Tonna Frederic, MD

## 2023-11-08 ENCOUNTER — Other Ambulatory Visit: Payer: Self-pay | Admitting: Family Medicine

## 2023-11-08 DIAGNOSIS — F5105 Insomnia due to other mental disorder: Secondary | ICD-10-CM

## 2023-11-08 DIAGNOSIS — Z7289 Other problems related to lifestyle: Secondary | ICD-10-CM

## 2023-11-10 ENCOUNTER — Other Ambulatory Visit: Payer: Self-pay | Admitting: Family Medicine

## 2023-11-10 DIAGNOSIS — G4762 Sleep related leg cramps: Secondary | ICD-10-CM

## 2024-01-14 ENCOUNTER — Encounter: Payer: Self-pay | Admitting: Podiatry

## 2024-01-14 ENCOUNTER — Ambulatory Visit (INDEPENDENT_AMBULATORY_CARE_PROVIDER_SITE_OTHER): Admitting: Podiatry

## 2024-01-14 DIAGNOSIS — M79674 Pain in right toe(s): Secondary | ICD-10-CM

## 2024-01-14 DIAGNOSIS — M79675 Pain in left toe(s): Secondary | ICD-10-CM | POA: Diagnosis not present

## 2024-01-14 DIAGNOSIS — B351 Tinea unguium: Secondary | ICD-10-CM

## 2024-01-14 NOTE — Progress Notes (Signed)
 Complaint:  Visit Type: Patient returns to my office for continued preventative foot care services.  Patient states" my nails have grown long and thick and become painful to walk and wear shoes".    The patient presents for preventative foot care services. She presents to the office with her caregiver.    Podiatric Exam: Vascular: dorsalis pedis and posterior tibial pulses are palpable bilateral. Capillary return is immediate. Temperature gradient is WNL. Skin turgor WNL  Sensorium: Normal Semmes Weinstein monofilament test. Normal tactile sensation bilaterally. Nail Exam: Pt has thick disfigured discolored nails with subungual debris noted bilateral entire nail hallux through fifth toenails Ulcer Exam: There is no evidence of ulcer or pre-ulcerative changes or infection. Orthopedic Exam: Muscle tone and strength are WNL. No limitations in general ROM. No crepitus or effusions noted. Foot type and digits show no abnormalities. Bony prominences are unremarkable. Pes planus. Skin: No Porokeratosis. No infection or ulcers.  Pinch callus symptomatic. B/L.  Diagnosis:  Onychomycosis, , Pain in right toe, pain in left toes,  Pinch callus hallux B/L.  Treatment & Plan Procedures and Treatment: Consent by patient was obtained for treatment procedures.   Debridement of mycotic and hypertrophic toenails, 1 through 5 bilateral and clearing of subungual debris using nail nipper followed by dremel   No ulceration, no infection noted.  Return Visit-Office Procedure: Patient instructed to return to the office for a follow up visit  3 months  for continued evaluation and treatment.    Ruffin Cotton DPM

## 2024-03-14 ENCOUNTER — Ambulatory Visit

## 2024-03-25 ENCOUNTER — Ambulatory Visit: Admitting: Family Medicine

## 2024-04-03 ENCOUNTER — Ambulatory Visit: Admitting: Family Medicine

## 2024-04-14 ENCOUNTER — Ambulatory Visit: Admitting: Podiatry

## 2024-04-16 ENCOUNTER — Encounter: Payer: Self-pay | Admitting: Podiatry

## 2024-04-16 ENCOUNTER — Ambulatory Visit: Admitting: Podiatry

## 2024-04-16 DIAGNOSIS — B351 Tinea unguium: Secondary | ICD-10-CM | POA: Diagnosis not present

## 2024-04-16 DIAGNOSIS — M79675 Pain in left toe(s): Secondary | ICD-10-CM

## 2024-04-16 DIAGNOSIS — M79674 Pain in right toe(s): Secondary | ICD-10-CM

## 2024-04-16 NOTE — Progress Notes (Signed)
 Complaint:  Visit Type: Patient returns to my office for continued preventative foot care services.  Patient states" my nails have grown long and thick and become painful to walk and wear shoes".    The patient presents for preventative foot care services. She presents to the office with her caregiver.    Podiatric Exam: Vascular: dorsalis pedis and posterior tibial pulses are palpable bilateral. Capillary return is immediate. Temperature gradient is WNL. Skin turgor WNL  Sensorium: Normal Semmes Weinstein monofilament test. Normal tactile sensation bilaterally. Nail Exam: Pt has thick disfigured discolored nails with subungual debris noted bilateral entire nail hallux through fifth toenails Ulcer Exam: There is no evidence of ulcer or pre-ulcerative changes or infection. Orthopedic Exam: Muscle tone and strength are WNL. No limitations in general ROM. No crepitus or effusions noted. Foot type and digits show no abnormalities. Bony prominences are unremarkable. Pes planus. Skin: No Porokeratosis. No infection or ulcers.  Pinch callus symptomatic. B/L.  Diagnosis:  Onychomycosis, , Pain in right toe, pain in left toes,  Pinch callus hallux B/L.  Treatment & Plan Procedures and Treatment: Consent by patient was obtained for treatment procedures.   Debridement of mycotic and hypertrophic toenails, 1 through 5 bilateral and clearing of subungual debris using nail nipper followed by dremel   No ulceration, no infection noted.  Return Visit-Office Procedure: Patient instructed to return to the office for a follow up visit  3 months  for continued evaluation and treatment.    Ruffin Cotton DPM

## 2024-05-08 ENCOUNTER — Ambulatory Visit (INDEPENDENT_AMBULATORY_CARE_PROVIDER_SITE_OTHER): Admitting: Family Medicine

## 2024-05-08 ENCOUNTER — Encounter: Payer: Self-pay | Admitting: Family Medicine

## 2024-05-08 VITALS — BP 134/78 | HR 62 | Temp 98.3°F | Wt 190.8 lb

## 2024-05-08 DIAGNOSIS — E039 Hypothyroidism, unspecified: Secondary | ICD-10-CM | POA: Diagnosis not present

## 2024-05-08 DIAGNOSIS — D494 Neoplasm of unspecified behavior of bladder: Secondary | ICD-10-CM | POA: Diagnosis not present

## 2024-05-08 DIAGNOSIS — Z1231 Encounter for screening mammogram for malignant neoplasm of breast: Secondary | ICD-10-CM | POA: Diagnosis not present

## 2024-05-08 DIAGNOSIS — F79 Unspecified intellectual disabilities: Secondary | ICD-10-CM

## 2024-05-08 DIAGNOSIS — R5381 Other malaise: Secondary | ICD-10-CM

## 2024-05-08 DIAGNOSIS — E559 Vitamin D deficiency, unspecified: Secondary | ICD-10-CM

## 2024-05-08 DIAGNOSIS — H547 Unspecified visual loss: Secondary | ICD-10-CM

## 2024-05-08 DIAGNOSIS — G4762 Sleep related leg cramps: Secondary | ICD-10-CM

## 2024-05-08 LAB — TSH: TSH: 3.9 u[IU]/mL (ref 0.35–5.50)

## 2024-05-08 LAB — VITAMIN D 25 HYDROXY (VIT D DEFICIENCY, FRACTURES): VITD: 39.35 ng/mL (ref 30.00–100.00)

## 2024-05-08 NOTE — Progress Notes (Addendum)
 "  Established Patient Office Visit   Subjective:  Patient ID: Abigail Webster, female    DOB: [birth year]  Age: 64 y.o. MRN: [id number]  No chief complaint on file.   HPI Encounter Diagnoses  Name Primary?   Acquired hypothyroidism Yes   Mentally challenged    Debilitated    Vitamin D  deficiency    Vision impairment    Bladder tumor    Encounter for screening mammogram for malignant neoplasm of breast    Nocturnal leg cramps    For follow-up of above.  Accompanied by the manager at her facility in [place], [place].  Continues with levothyroxine  50 mcg daily and 2000 IUs of vitamin D3 daily.  Status post cystoscopy for follow-up of bladder tumor back in September.  She is due for follow-up with urology and March.   Review of Systems  Constitutional: Negative.   HENT: Negative.    Eyes:  Negative for blurred vision, discharge and redness.  Respiratory: Negative.    Cardiovascular: Negative.   Gastrointestinal:  Negative for abdominal pain.  Genitourinary: Negative.   Musculoskeletal: Negative.  Negative for myalgias.  Skin:  Negative for rash.  Neurological:  Negative for tingling, loss of consciousness and weakness.  Endo/Heme/Allergies:  Negative for polydipsia.    Current Medications[1]   Objective:     BP 134/78 (BP Location: Right Arm, Patient Position: Sitting, Cuff Size: Normal)   Pulse 62   Temp 98.3 F (36.8 C) (Oral)   Wt 190 lb 12.8 oz (86.5 kg)   SpO2 96%   BMI 37.26 kg/m  BP Readings from Last 3 Encounters:  05/08/24 134/78  09/20/23 130/80  05/21/23 130/84   Wt Readings from Last 3 Encounters:  05/08/24 190 lb 12.8 oz (86.5 kg)  05/21/23 178 lb 3.2 oz (80.8 kg)  04/23/23 174 lb 9.6 oz (79.2 kg)      Physical Exam Constitutional:      General: She is not in acute distress.    Appearance: Normal appearance. She is not ill-appearing, toxic-appearing or diaphoretic.  HENT:     Head: Normocephalic and atraumatic.     Right Ear: External ear  normal.     Left Ear: External ear normal.  Eyes:     General: No scleral icterus.       Right eye: No discharge.        Left eye: No discharge.     Extraocular Movements: Extraocular movements intact.     Conjunctiva/sclera: Conjunctivae normal.  Pulmonary:     Effort: Pulmonary effort is normal. No respiratory distress.     Breath sounds: No wheezing or rales.  Skin:    General: Skin is warm and dry.  Neurological:     Mental Status: She is alert and oriented to person, place, and time.  Psychiatric:        Mood and Affect: Mood normal.        Behavior: Behavior normal.   He is at he is a nice   Results for orders placed or performed in visit on 05/08/24  TSH  Result Value Ref Range   TSH 3.90 0.35 - 5.50 uIU/mL  VITAMIN D  25 Hydroxy (Vit-D Deficiency, Fractures)  Result Value Ref Range   VITD 39.35 30.00 - 100.00 ng/mL      The 10-year ASCVD risk score (Arnett DK, et al., 2019) is: 7.4%    Assessment & Plan:   Acquired hypothyroidism -     TSH -     Levothyroxine   Sodium; TAKE 1 TABLET BY MOUTH EVERY MORNING ON A FASTING STOMACH 1 HOUR BEFORE EATING  Dispense: 90 tablet; Refill: 3  Mentally challenged  Debilitated  Vitamin D  deficiency -     VITAMIN D  25 Hydroxy (Vit-D Deficiency, Fractures) -     D3 Super Strength; Take 1 capsule (2,000 Units total) by mouth in the morning and at bedtime.  Dispense: 180 capsule; Refill: 3  Vision impairment  Bladder tumor  Encounter for screening mammogram for malignant neoplasm of breast -     3D Screening Mammogram, Left and Right; Future  Nocturnal leg cramps    Return in about 6 months (around 11/05/2024), or if symptoms worsen or fail to improve.   TSH and vitamin D  labs returned in the normal range we will continue current dosing and prescriptions have been added to above.   Elsie Sim Lent, MD     [1]  Current Outpatient Medications:    Emollient (EUCERIN EX), Apply 1 application topically daily.,  Disp: , Rfl:    EPINEPHrine  0.3 mg/0.3 mL IJ SOAJ injection, Inject 0.3 mg into the muscle as needed for anaphylaxis., Disp: 2 each, Rfl: 1   gabapentin  (NEURONTIN ) 100 MG capsule, TAKE 1 CAPSULE BY MOUTH AT BEDTIME AS NEEDED FOR LEG CRAMPS (Patient taking differently: Take 100 mg by mouth as needed.), Disp: 90 capsule, Rfl: 1   MYRBETRIQ 50 MG TB24 tablet, Take 50 mg by mouth daily., Disp: , Rfl:    QUEtiapine  (SEROQUEL ) 100 MG tablet, TAKE ONE TABLET BY MOUTH AT BEDTIME, Disp: 90 tablet, Rfl: 3   RESTASIS 0.05 % ophthalmic emulsion, Place 1 drop into both eyes 2 (two) times daily., Disp: , Rfl:    Scar Treatment Products [facility] ADVANCED SCAR GEL) GEL, Cover healed wounds daily with a thin coat., Disp: 50 g, Rfl: 1   Cholecalciferol (D3 SUPER STRENGTH) 50 MCG (2000 UT) CAPS, Take 1 capsule (2,000 Units total) by mouth in the morning and at bedtime., Disp: 180 capsule, Rfl: 3   levothyroxine  (SYNTHROID ) 50 MCG tablet, TAKE 1 TABLET BY MOUTH EVERY MORNING ON A FASTING STOMACH 1 HOUR BEFORE EATING, Disp: 90 tablet, Rfl: 3  "

## 2024-05-09 ENCOUNTER — Ambulatory Visit: Admitting: Family Medicine

## 2024-05-09 ENCOUNTER — Ambulatory Visit: Payer: Self-pay | Admitting: Family Medicine

## 2024-05-09 MED ORDER — LEVOTHYROXINE SODIUM 50 MCG PO TABS: ORAL_TABLET | ORAL | 3 refills | Status: AC

## 2024-05-09 MED ORDER — D3 SUPER STRENGTH 50 MCG (2000 UT) PO CAPS: 2000.0000 [IU] | ORAL_CAPSULE | Freq: Two times a day (BID) | ORAL | 3 refills | Status: AC

## 2024-05-09 NOTE — Addendum Note (Signed)
 Addended by: BERNETA ELSIE LABOR on: 05/09/2024 09:03 AM   Modules accepted: Orders, Level of Service

## 2024-05-26 ENCOUNTER — Ambulatory Visit: Admitting: Pulmonary Disease

## 2024-05-29 ENCOUNTER — Other Ambulatory Visit: Payer: Self-pay | Admitting: Family Medicine

## 2024-05-29 MED ORDER — MYRBETRIQ 50 MG PO TB24: 50.0000 mg | ORAL_TABLET | Freq: Every day | ORAL | 5 refills | Status: AC

## 2024-05-29 NOTE — Telephone Encounter (Signed)
 Prescription Request  05/29/2024  LOV: 05/08/2024  What is the name of the medication or equipment? MYRBETRIQ  50 MG TB24 tablet [[id number]]   Have you contacted your pharmacy to request a refill? No, she has a new pharmacy. I let pt's guardian know I can't see where Dr Berneta has filled this, she insisted he had.   Which pharmacy would you like this sent to?  [facility]     Patient notified that their request is being sent to the clinical staff for review and that they should receive a response within 2 business days.   Please advise at Mobile [phone] (mobile) Ebony, guardian, please let Bobetta know if this gets filled.

## 2024-05-29 NOTE — Telephone Encounter (Signed)
 Refill request for  Myrbetriq  50 mg LR hx provider LOV 05/08/24 FOV  11/06/24  Please review and advise.  Thanks.  Dm/cma

## 2024-06-03 ENCOUNTER — Ambulatory Visit (HOSPITAL_BASED_OUTPATIENT_CLINIC_OR_DEPARTMENT_OTHER)

## 2024-06-10 ENCOUNTER — Ambulatory Visit (HOSPITAL_BASED_OUTPATIENT_CLINIC_OR_DEPARTMENT_OTHER)

## 2024-06-12 ENCOUNTER — Ambulatory Visit (HOSPITAL_BASED_OUTPATIENT_CLINIC_OR_DEPARTMENT_OTHER)

## 2024-07-02 ENCOUNTER — Ambulatory Visit: Admitting: Pulmonary Disease

## 2024-07-08 ENCOUNTER — Ambulatory Visit

## 2024-07-16 ENCOUNTER — Ambulatory Visit: Admitting: Podiatry

## 2024-11-06 ENCOUNTER — Ambulatory Visit: Admitting: Family Medicine
